# Patient Record
Sex: Female | Born: 1960 | Race: White | Hispanic: No | Marital: Married | Smoking: Never smoker
Health system: Southern US, Community
[De-identification: ages and names within clinical notes are randomized; demographics above are authoritative.]

## PROBLEM LIST (undated history)

## (undated) DIAGNOSIS — G4733 Obstructive sleep apnea (adult) (pediatric): Secondary | ICD-10-CM

## (undated) DIAGNOSIS — G4730 Sleep apnea, unspecified: Secondary | ICD-10-CM

## (undated) DIAGNOSIS — N951 Menopausal and female climacteric states: Secondary | ICD-10-CM

## (undated) DIAGNOSIS — R42 Dizziness and giddiness: Secondary | ICD-10-CM

## (undated) HISTORY — DX: Dizziness and giddiness: R42

## (undated) HISTORY — DX: Obstructive sleep apnea (adult) (pediatric): G47.33

## (undated) HISTORY — PX: COLONOSCOPY: SHX174

## (undated) HISTORY — DX: Sleep apnea, unspecified: G47.30

## (undated) HISTORY — DX: Menopausal and female climacteric states: N95.1

---

## 1990-07-09 HISTORY — PX: TUBAL LIGATION: SHX77

## 2012-10-24 ENCOUNTER — Encounter: Payer: Self-pay | Admitting: Neurology

## 2012-10-30 NOTE — Progress Notes (Signed)
Quick Note:  I reviewed the patient's CPAP compliance data from 04/20/2012 to 4/10 2014, which is a total of 180 days, during which time the patient used CPAP every day. The average usage for all days was 6 hours and 56 minutes. The percent used days greater than 4 hours was 97.2 %, indicating excellent compliance. The residual AHI was 1 per hour, indicating an appropriate treatment pressure of 9 cwp.   Huston Foley, MD, PhD [facility] ([facility])   ______

## 2012-11-10 ENCOUNTER — Telehealth: Payer: Self-pay | Admitting: Neurology

## 2012-11-10 DIAGNOSIS — G4733 Obstructive sleep apnea (adult) (pediatric): Secondary | ICD-10-CM

## 2012-11-10 NOTE — Telephone Encounter (Signed)
CPAp download reviewed.

## 2012-12-03 ENCOUNTER — Encounter: Payer: Self-pay | Admitting: Neurology

## 2012-12-03 ENCOUNTER — Ambulatory Visit (INDEPENDENT_AMBULATORY_CARE_PROVIDER_SITE_OTHER): Payer: Managed Care, Other (non HMO) | Admitting: Neurology

## 2012-12-03 VITALS — BP 112/74 | HR 81 | Temp 98.7°F | Ht 66.0 in | Wt 229.0 lb

## 2012-12-03 DIAGNOSIS — G4733 Obstructive sleep apnea (adult) (pediatric): Secondary | ICD-10-CM

## 2012-12-03 DIAGNOSIS — N951 Menopausal and female climacteric states: Secondary | ICD-10-CM | POA: Insufficient documentation

## 2012-12-03 NOTE — Progress Notes (Signed)
[facility]  Provider:  Dr Wanya Bangura Referring Provider: Harvie Heck, MD Primary Care Physician:  Harvie Heck, MD  Chief Complaint  Patient presents with  . New Evaluation    sleep study results, rm 11    HPI:  Natasha Lee is a 52 y.o. female here as a referral from Dr. Larita Fife  MD. Natasha Lee was originally referred at the end of 2012 directly for a sleep study the sleep study was performed on 1-11 2013 and current into a split study. She had presented with a past medical history of morbid obesity a weight gain of approximately 50 pounds within the past year prior to study and hypertension, ankle edema. The patient presented with multiple sleep complaints including loud snoring which of bulk or, witnessed apneas, frequent nocturia, unrefreshing nonrestorative sleep. The patient also had excessive daytime hypersomnolence reflected in the pre-study Epworth Sleepiness Scale and Natasha Lee at 19 points. Beck Depression Inventory was endorsed at 22 points. Her BMI at the time of the study was 37. Neck circumference 17 inches.  The patient's bedtime had been 10 PM she geot up at 4:30 AM may have one or 4-5 bathroom breaks at night. 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 She changed in2008 to first shift and started gaining weight rapidly.    She was titrated to 9 cm water and reached an AHI of 0 during that sleep study in 2013 - felt immediately better. Chose a Nasal pillow.Pilairo model .  A download at and involving forwarded from [facility] shows an average residual AHI of 1.0 at CPAP pressure of 9 cm and 100% compliance for 90 days.  The patient has been very happy with her CPAP if faxed she said that she cannot easily go to sleep without it and feels that she certainly would never be as restored and she feels now  in the morning without adhering to CPAP therapy she endorsed today her sleepiness score at 8 points.  Prior to CPAP the patient had nocturia and frequent bathroom breaks, she now has one or 2 bathroom break at the moles to on CPAP she also takes her furosemide in the morning which was initiated to combat ankle edema. This medication change  Took place after the sleep study. Aware that her main risk factor for sleep apnea is her elevated body mass index and her primary care physician gave her a prescription of Phentermine, which was just recently initiated but has already allowed for a 12 pound weight loss over a month.  Patient  has no history of nasal septal deviation or facial oral surgeries that may have affected the upper airway. She never had a tonsillectomy. 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  Became a dialysis patient and has last years of life, had diabetes and diabetic related renal failure as well as high blood pressure.He had apnea but was not treated . Maternal grandmother had similar problems , HTN, DM and renal failure. These family members were not overweight.         Review of Systems: Out of a complete 14 system review, the patient complains of only the following symptoms, and all other reviewed systems are negative.  intended weight loss, Epworth down to 8 from 19  points pre CPAP.   History   Social History  . Marital Status: Married    Spouse Name: N/A    Number of Children: 2  . Years of Education: 12   Occupational History  .  [employer]   Social History Main Topics  . Smoking status: Never Smoker   . Smokeless tobacco: Not on file  . Alcohol Use: No  . Drug Use: No  . Sexually Active: Not on file   Other Topics Concern  . Not on file   Social History Narrative  . No narrative on file    Family History  Problem Relation Age of Onset  . Hypertension Father     Past Medical History   Diagnosis Date  . Vertigo   . Menopausal symptoms   . Obstructive apnea     sleep study 07-20-11 , AHI 61.6- OSA  9 cm CPAP.   Marland Kitchen Menopause syndrome     Past Surgical History  Procedure Laterality Date  . Tubal ligation  1992    Current Outpatient Prescriptions  Medication Sig Dispense Refill  . Cetirizine HCl (ZYRTEC ALLERGY PO) Take by mouth daily as needed.      . furosemide (LASIX) 20 MG tablet PCP for edema      . phentermine (ADIPEX-P) 37.5 MG tablet Weight loss , 12 pounds  Loss,  Italy Badger, MD .       No current facility-administered medications for this visit.    Allergies as of 12/03/2012  . (No Known Allergies)    Vitals: BP 112/74  Pulse 81  Temp(Src) 98.7 F (37.1 C) (Oral)  Ht 5\' 6"  (1.676 m)  Wt 229 lb (103.874 kg)  BMI 36.98 kg/m2 Last Weight:  Wt Readings from Last 1 Encounters:  12/03/12 229 lb (103.874 kg)   Last Height:   Ht Readings from Last 1 Encounters:  12/03/12 5\' 6"  (1.676 m)   Physical exam:  General: The patient is awake, alert and appears not in acute distress. The patient is well groomed. Head: Normocephalic, atraumatic. Neck is supple. Mallampati highest grade, uvula is poorly visible. , neck circumference:17.inches  Cardiovascular:  Regular rate and rhythm, without  murmurs or carotid bruit, and without distended neck veins. Respiratory: Lungs are clear to auscultation. Skin:  Without evidence of edema, or rash Trunk: BMI is elevated to morbid obesity , patient  has normal posture.  Neurologic exam : The patient is awake and alert, oriented to place and time.  Memory subjective  described as intact. There is a normal attention span & concentration ability. Speech is fluent without  dysarthria, dysphonia or aphasia. Mood and affect are appropriate.  Cranial nerves: Pupils are equal and briskly reactive to light. Funduscopic exam without  evidence of pallor or edema. Extraocular movements  in vertical and horizontal planes intact  and without nystagmus. Visual fields by finger perimetry are intact. Hearing to finger rub intact.  Facial sensation intact to fine touch. Facial motor strength is symmetric and tongue and uvula move midline.  Motor exam:   Normal tone and normal muscle bulk and symmetric normal strength in all extremities.  Sensory:  Fine touch, pinprick and vibration were tested in all extremities. Proprioception is tested in the upper extremities only.  Coordination: Rapid alternating movements in the fingers/hands is tested and normal. Finger-to-nose maneuver tested and normal without evidence of ataxia, dysmetria or tremor.  Gait and station: Patient walks without assistive device and is able and assisted stool climb up to the  exam table. Strength within normal limits. Deep tendon reflexes: in the  upper and lower extremities are symmetric and intact.   Assessment:  After physical and neurologic examination, review of laboratory studies, imaging, neurophysiology testing and pre-existing records, assessment will be reviewed on the problem list.  Plan:  Treatment plan and additional workup will be reviewed under Problem List.  Is read is well aware that her main risk factor for obstructive sleep apnea as her body weight, she also has mild retrognathia which further narrows for upper airway. A dental device is deemed not to be efficient in the apnea of the degree that this patient presented with. She is 100% compliant with CPAP and her Epworth has been reduced by 10 points, she has no longer nocturia, she has no longer ankle it deep. She is very motivated to continue its use. I will have another download of CPAP data end 3 months and then fall the patient yearly. Educational material is provided the risk factors for apnea and apnea as a risk factor of 4 secondary diseases I discussed and documented. The patient is well informed about her condition.Marland Kitchen

## 2012-12-03 NOTE — Patient Instructions (Signed)
Weight loss by the  2 and 5 diet, also known as    CPAP and BIPAP CPAP and BIPAP are methods of helping you breathe. CPAP stands for "continuous positive airway pressure." BIPAP stands for "bi-level positive airway pressure." Both CPAP and BIPAP are provided by a small machine with a flexible plastic tube that attaches to a plastic mask that goes over your nose or mouth. Air is blown into your air passages through your nose or mouth. This helps to keep your airways open and helps to keep you breathing well. The amount of pressure that is used to blow the air into your air passages can be set on the machine. The pressure setting is based on your needs. With CPAP, the amount of pressure stays the same while you breathe in and out. With BIPAP, the amount of pressure changes when you inhale and exhale. Your caregiver will recommend whether CPAP or BIPAP would be more helpful for you.  CPAP and BIPAP can be helpful for both adults and children with:  Sleep apnea.  Chronic Obstructive Pulmonary Disease (COPD), a condition like emphysema.  Diseases which weaken the muscles of the chest such as muscular dystrophy or neurological diseases.  Other problems that cause breathing to be weak or difficult. USE OF CPAP OR BIPAP The respiratory therapist or technician will help you get used to wearing the mask. Some people feel claustrophobic (a trapped or closed in feeling) at first, because the mask needs to be fairly snug on your face.   It may help you to get used to the mask gradually, by first holding the mask loosely over your nose or mouth using a low pressure setting on the machine. Gradually the mask can be applied more snugly with increased pressure. You can also gradually increase the amount of time the mask is used.  People with sleep apnea will use the mask and machine at night when they are sleeping. Others, like those with ALS or other breathing difficulties, may need the CPAP or BIPAP all the  time.  If the first mask you try does not fit well, or is uncomfortable, there are other types and sizes that can be tried.  If you tend to breathe through your mouth, a chin strap may be applied to help keep your mouth closed (if you are using a nasal mask).  The CPAP and BIPAP machines have alarms that may sound if the mask comes off or develops a leak.  You should not eat or drink while the CPAP or BIPAP is on. Food or fluids could get pushed into your lungs by the pressure of the CPAP or BIPAP. Sometimes CPAP or BIPAP machines are ordered for home use. If you are going to use the CPAP or BIPAP machine at home, follow these instructions  CPAP or BIPAP machines can be rented or purchased through home health care companies. There are many different brands of machines available. If you rent a machine before purchasing you may find which particular machine works well for you.  Ask questions if there is something you do not understand when picking out your machine.  Place your CPAP or BIPAP machine on a secure table or stand near an electrical outlet.  Know where the On/Off switch is.  Follow your doctor's instructions for how to set the pressure on your machine and when you should use it.  Do not smoke! Tobacco smoke residue can damage the machine. SEEK IMMEDIATE MEDICAL CARE IF:   You  have redness or open areas around your nose or mouth.  You have trouble operating the CPAP or BIPAP machine.  You cannot tolerate wearing the CPAP or BIPAP mask.  You have any questions or concerns. Document Released: 03/23/2004 Document Revised: 09/17/2011 Document Reviewed: 06/22/2008 [facility] Patient Information 2014 [place], [place]. Sleep Apnea  Sleep apnea is a sleep disorder characterized by abnormal pauses in breathing while you sleep. When your breathing pauses, the level of oxygen in your blood decreases. This causes you to move out of deep sleep and into light sleep. As a result, your quality  of sleep is poor, and the system that carries your blood throughout your body (cardiovascular system) experiences stress. If sleep apnea remains untreated, the following conditions can develop:  High blood pressure (hypertension).  Coronary artery disease.  Inability to achieve or maintain an erection (impotence).  Impairment of your thought process (cognitive dysfunction). There are three types of sleep apnea: 1. Obstructive sleep apnea Pauses in breathing during sleep because of a blocked airway. 2. Central sleep apnea Pauses in breathing during sleep because the area of the brain that controls your breathing does not send the correct signals to the muscles that control breathing. 3. Mixed sleep apnea A combination of both obstructive and central sleep apnea. RISK FACTORS The following risk factors can increase your risk of developing sleep apnea:  Being overweight.  Smoking.  Having narrow passages in your nose and throat.  Being of older age.  Being female.  Alcohol use.  Sedative and tranquilizer use.  Ethnicity. Among individuals younger than 35 years, African Americans are at increased risk of sleep apnea. SYMPTOMS   Difficulty staying asleep.  Daytime sleepiness and fatigue.  Loss of energy.  Irritability.  Loud, heavy snoring.  Morning headaches.  Trouble concentrating.  Forgetfulness.  Decreased interest in sex. DIAGNOSIS  In order to diagnose sleep apnea, your caregiver will perform a physical examination. Your caregiver may suggest that you take a home sleep test. Your caregiver may also recommend that you spend the night in a sleep lab. In the sleep lab, several monitors record information about your heart, lungs, and brain while you sleep. Your leg and arm movements and blood oxygen level are also recorded. TREATMENT The following actions may help to resolve mild sleep apnea:  Sleeping on your side.   Using a decongestant if you have nasal  congestion.   Avoiding the use of depressants, including alcohol, sedatives, and narcotics.   Losing weight and modifying your diet if you are overweight. There also are devices and treatments to help open your airway:  Oral appliances. These are custom-made mouthpieces that shift your lower jaw forward and slightly open your bite. This opens your airway.  Devices that create positive airway pressure. This positive pressure "splints" your airway open to help you breathe better during sleep. The following devices create positive airway pressure:  Continuous positive airway pressure (CPAP) device. The CPAP device creates a continuous level of air pressure with an air pump. The air is delivered to your airway through a mask while you sleep. This continuous pressure keeps your airway open.  Nasal expiratory positive airway pressure (EPAP) device. The EPAP device creates positive air pressure as you exhale. The device consists of single-use valves, which are inserted into each nostril and held in place by adhesive. The valves create very little resistance when you inhale but create much more resistance when you exhale. That increased resistance creates the positive airway pressure. This positive  pressure while you exhale keeps your airway open, making it easier to breath when you inhale again.  Bilevel positive airway pressure (BPAP) device. The BPAP device is used mainly in patients with central sleep apnea. This device is similar to the CPAP device because it also uses an air pump to deliver continuous air pressure through a mask. However, with the BPAP machine, the pressure is set at two different levels. The pressure when you exhale is lower than the pressure when you inhale.  Surgery. Typically, surgery is only done if you cannot comply with less invasive treatments or if the less invasive treatments do not improve your condition. Surgery involves removing excess tissue in your airway to create a  wider passage way. Document Released: 06/15/2002 Document Revised: 12/25/2011 Document Reviewed: 11/01/2011 [facility] Patient Information 2014 [place], [place].  intermittent fast. I encouraged the patient to look at either the book of Dr. Leland Johns or watch the [facility] documentary .  Exercise to Lose Weight Exercise and a healthy diet may help you lose weight. Your doctor may suggest specific exercises. EXERCISE IDEAS AND TIPS  Choose low-cost things you enjoy doing, such as walking, bicycling, or exercising to workout videos.  Take stairs instead of the elevator.  Walk during your lunch break.  Park your car further away from work or school.  Go to a gym or an exercise class.  Start with 5 to 10 minutes of exercise each day. Build up to 30 minutes of exercise 4 to 6 days a week.  Wear shoes with good support and comfortable clothes.  Stretch before and after working out.  Work out until you breathe harder and your heart beats faster.  Drink extra water when you exercise.  Do not do so much that you hurt yourself, feel dizzy, or get very short of breath. Exercises that burn about 150 calories:  Running 1  miles in 15 minutes.  Playing volleyball for 45 to 60 minutes.  Washing and waxing a car for 45 to 60 minutes.  Playing touch football for 45 minutes.  Walking 1  miles in 35 minutes.  Pushing a stroller 1  miles in 30 minutes.  Playing basketball for 30 minutes.  Raking leaves for 30 minutes.  Bicycling 5 miles in 30 minutes.  Walking 2 miles in 30 minutes.  Dancing for 30 minutes.  Shoveling snow for 15 minutes.  Swimming laps for 20 minutes.  Walking up stairs for 15 minutes.  Bicycling 4 miles in 15 minutes.  Gardening for 30 to 45 minutes.  Jumping rope for 15 minutes.  Washing windows or floors for 45 to 60 minutes. Document Released: 07/28/2010 Document Revised: 09/17/2011 Document Reviewed: 07/28/2010 [facility] Patient Information 2014  [place], [place].

## 2012-12-03 NOTE — Assessment & Plan Note (Signed)
This is a first encounter for this patient after  of 14 months on CPAP.  Current download provided [facility] F[facility]rene Fearing Bobbitt) at residual AHI of 1.0 at 9  cm CPAP and an average he was at time of 7 hours 8 minutes 100% compliance..date 07-14-12  07-14-12.

## 2012-12-06 ENCOUNTER — Encounter: Payer: Self-pay | Admitting: Neurology

## 2013-02-27 ENCOUNTER — Ambulatory Visit: Payer: Managed Care, Other (non HMO) | Admitting: Neurology

## 2013-03-04 ENCOUNTER — Ambulatory Visit: Payer: Managed Care, Other (non HMO) | Admitting: Neurology

## 2013-03-13 ENCOUNTER — Ambulatory Visit: Payer: Managed Care, Other (non HMO) | Admitting: Neurology

## 2013-03-31 ENCOUNTER — Telehealth: Payer: Self-pay | Admitting: Neurology

## 2013-03-31 NOTE — Telephone Encounter (Signed)
I called and left a message for the patient to callback to the office to r/s her Dec. 24 appt. with Dr. Dohmeier. 

## 2013-04-24 ENCOUNTER — Encounter: Payer: Self-pay | Admitting: Neurology

## 2013-05-05 ENCOUNTER — Encounter: Payer: Self-pay | Admitting: Neurology

## 2013-07-01 ENCOUNTER — Ambulatory Visit: Payer: Managed Care, Other (non HMO) | Admitting: Neurology

## 2013-11-03 ENCOUNTER — Encounter: Payer: Self-pay | Admitting: Neurology

## 2013-11-05 ENCOUNTER — Encounter: Payer: Self-pay | Admitting: Neurology

## 2014-01-06 ENCOUNTER — Encounter (INDEPENDENT_AMBULATORY_CARE_PROVIDER_SITE_OTHER): Payer: Self-pay

## 2014-01-06 ENCOUNTER — Encounter: Payer: Self-pay | Admitting: Neurology

## 2014-01-06 ENCOUNTER — Ambulatory Visit (INDEPENDENT_AMBULATORY_CARE_PROVIDER_SITE_OTHER): Payer: BC Managed Care – PPO | Admitting: Neurology

## 2014-01-06 VITALS — BP 114/75 | HR 91 | Resp 18 | Ht 65.75 in | Wt 235.0 lb

## 2014-01-06 DIAGNOSIS — G4733 Obstructive sleep apnea (adult) (pediatric): Secondary | ICD-10-CM | POA: Insufficient documentation

## 2014-01-06 DIAGNOSIS — Z9989 Dependence on other enabling machines and devices: Principal | ICD-10-CM

## 2014-01-06 NOTE — Patient Instructions (Signed)
Sleep Apnea  Sleep apnea is a sleep disorder characterized by abnormal pauses in breathing while you sleep. When your breathing pauses, the level of oxygen in your blood decreases. This causes you to move out of deep sleep and into light sleep. As a result, your quality of sleep is poor, and the system that carries your blood throughout your body (cardiovascular system) experiences stress. If sleep apnea remains untreated, the following conditions can develop:  High blood pressure (hypertension).  Coronary artery disease.  Inability to achieve or maintain an erection (impotence).  Impairment of your thought process (cognitive dysfunction). There are three types of sleep apnea: 1. Obstructive sleep apnea--Pauses in breathing during sleep because of a blocked airway. 2. Central sleep apnea--Pauses in breathing during sleep because the area of the brain that controls your breathing does not send the correct signals to the muscles that control breathing. 3. Mixed sleep apnea--A combination of both obstructive and central sleep apnea. RISK FACTORS The following risk factors can increase your risk of developing sleep apnea:  Being overweight.  Smoking.  Having narrow passages in your nose and throat.  Being of older age.  Being female.  Alcohol use.  Sedative and tranquilizer use.  Ethnicity. Among individuals younger than 35 years, African Americans are at increased risk of sleep apnea. SYMPTOMS   Difficulty staying asleep.  Daytime sleepiness and fatigue.  Loss of energy.  Irritability.  Loud, heavy snoring.  Morning headaches.  Trouble concentrating.  Forgetfulness.  Decreased interest in sex. DIAGNOSIS  In order to diagnose sleep apnea, your caregiver will perform a physical examination. Your caregiver may suggest that you take a home sleep test. Your caregiver may also recommend that you spend the night in a sleep lab. In the sleep lab, several monitors record  information about your heart, lungs, and brain while you sleep. Your leg and arm movements and blood oxygen level are also recorded. TREATMENT The following actions may help to resolve mild sleep apnea:  Sleeping on your side.   Using a decongestant if you have nasal congestion.   Avoiding the use of depressants, including alcohol, sedatives, and narcotics.   Losing weight and modifying your diet if you are overweight. There also are devices and treatments to help open your airway:  Oral appliances. These are custom-made mouthpieces that shift your lower jaw forward and slightly open your bite. This opens your airway.  Devices that create positive airway pressure. This positive pressure "splints" your airway open to help you breathe better during sleep. The following devices create positive airway pressure:  Continuous positive airway pressure (CPAP) device. The CPAP device creates a continuous level of air pressure with an air pump. The air is delivered to your airway through a mask while you sleep. This continuous pressure keeps your airway open.  Nasal expiratory positive airway pressure (EPAP) device. The EPAP device creates positive air pressure as you exhale. The device consists of single-use valves, which are inserted into each nostril and held in place by adhesive. The valves create very little resistance when you inhale but create much more resistance when you exhale. That increased resistance creates the positive airway pressure. This positive pressure while you exhale keeps your airway open, making it easier to breath when you inhale again.  Bilevel positive airway pressure (BPAP) device. The BPAP device is used mainly in patients with central sleep apnea. This device is similar to the CPAP device because it also uses an air pump to deliver continuous air pressure   through a mask. However, with the BPAP machine, the pressure is set at two different levels. The pressure when you  exhale is lower than the pressure when you inhale.  Surgery. Typically, surgery is only done if you cannot comply with less invasive treatments or if the less invasive treatments do not improve your condition. Surgery involves removing excess tissue in your airway to create a wider passage way. Document Released: 06/15/2002 Document Revised: 10/20/2012 Document Reviewed: 11/01/2011 [employer] Patient Information 2015 [employer]. This information is not intended to replace advice given to you by your health care provider. Make sure you discuss any questions you have with your health care provider.  

## 2014-01-06 NOTE — Progress Notes (Signed)
[facility]  Provider:  Dr Cuong Moorman Referring Provider: Harvie Heck, MD Primary Care Physician:  Harvie Heck, MD  Chief Complaint  Patient presents with  . Follow-up    Room 11  . Sleep Apnea    HPI:    Natasha Lee is here today for her yearly revisit. 01-06-14 This is also meant to establish her CPAP compliance. The patient has been on  CPAP 28 month had been followed by [facility]. AHI has been 0.8 /hr. at a CPAP pressure of 9 cm water.  The average time of CPAP use at night to 6 hours and 27 minutes and she has 100% compliance for 30 day download. She has brought her machine to this visit.  The patient endorsed fatigue severity score at 26 points and the Epworth score at 3 points.     Prior to CPAP, the sleepiness score was endorsed at 19 points and her fatigue severity was extremely high as well. She goes to bed between 9 and 10 PM, goes to sleep promptly. Rises at 4.30 AM, she has 2 dogs to walk. Her husband rises an hour later. She drinks no coffee in the morning , neither any other form of caffeine.  She does not nap in the afternoon.  She wakes restored, refreshed, without headaches or dry mouth.   She gained weight from menopause. Hot flashes still present, but not interrupting her sleep as much.      PMHX: last visit 2014 :   Natasha Lee is a 53 y.o. female here as a referral from Dr. Larita Fife  MD. [name] was originally referred at the end of 2012 directly for a sleep study,  sleep study was performed on 1-11 2013 and turned into a split study.  She had presented with a past medical history of morbid obesity,  a weight gain of approximately 50 pounds within the past year prior to study , and hypertension, ankle edema.  The patient presented with multiple sleep complaints:  including loud snoring , witnessed apneas, frequent nocturia, unrefreshing non-restorative sleep.  The patient also had excessive daytime hypersomnolence reflected in the  pre-study Epworth Sleepiness Scale and Natasha Lee at 19 points. Beck Depression Inventory was endorsed at 22 points. Her BMI at the time of the study was 37. Neck circumference 17 inches.  The patient's bedtime had been 10 PM she geot up at 4:30 AM may have one or 4-5 bathroom breaks at night. 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 She changed in2008 to first shift and started gaining weight rapidly.    She was titrated to 9 cm water and reached an AHI of 0 during that sleep study in 2013 - felt immediately better. Chose a Nasal pillow.Pilairo model .  A download at and involving forwarded from [facility] shows an average residual AHI of 1.0 at CPAP pressure of 9 cm and 100% compliance for 90 days.  The patient has been very happy with her CPAP if faxed she said that she cannot easily go to sleep without it and feels that she certainly would never be as restored and she feels now in the morning without adhering to CPAP therapy she endorsed today her sleepiness score at 8 points.  Prior to CPAP the patient had nocturia and frequent bathroom breaks, she now has  one or 2 bathroom break at the moles to on CPAP she also takes her furosemide in the morning which was initiated to combat ankle edema. This medication change  Took place after the sleep study.  Patient  has no history of nasal septal deviation or facial oral surgeries that may have affected the upper airway. She never had a tonsillectomy.  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  Became a dialysis patient and has last years of life, had diabetes and diabetic related renal failure as well as high blood pressure.He had apnea but was not treated . Maternal grandmother had similar problems , HTN, DM and renal failure. These family  members were not overweight.   She has a third shift work history , until 10 years ago.      Review of Systems: Out of a complete 14 system review, the patient complains of only the following symptoms, and all other reviewed systems are negative.  intended weight loss, Epworth down to 3 from last years count at  8 points and  from 18-19 points pre CPAP.   History   Social History  . Marital Status: Married    Spouse Name: Natasha Lee    Number of Children: 2  . Years of Education: 12   Occupational History  .  [employer]   Social History Main Topics  . Smoking status: Never Smoker   . Smokeless tobacco: Never Used  . Alcohol Use: No  . Drug Use: No  . Sexual Activity: Not on file   Other Topics Concern  . Not on file   Social History Narrative   Patient is married Natasha Needle(Michael ) and lives at home with her husband and one of her children.   Patient has two adult children.   Patient is working full-time.   Patient has a high school education.   Patient is right-handed.   Patient does not drink any caffeine.    Family History  Problem Relation Age of Onset  . Hypertension Father     Past Medical History  Diagnosis Date  . Vertigo   . Menopausal symptoms   . Obstructive apnea     sleep study 07-20-11 , AHI 61.6- OSA  9 cm CPAP.   Marland Kitchen. Menopause syndrome     Past Surgical History  Procedure Laterality Date  . Tubal ligation  1992    Current Outpatient Prescriptions  Medication Sig Dispense Refill  . Cetirizine HCl (ZYRTEC ALLERGY PO) Take by mouth daily as needed.      . furosemide (LASIX) 20 MG tablet PCP for edema       No current facility-administered medications for this visit.    Allergies as of 01/06/2014  . (No Known Allergies)    Vitals: BP 114/75  Pulse 91  Resp 18  Ht 5' 5.75" (1.67 m)  Wt 235 lb (106.595 kg)  BMI 38.22 kg/m2 Last Weight:  Wt Readings from Last 1 Encounters:  01/06/14 235 lb (106.595 kg)   Last Height:   Ht Readings from Last 1  Encounters:  01/06/14 5' 5.75" (1.67 m)   Physical exam:  General: The patient is awake, alert and appears not in acute distress. The patient is well groomed. Head: Normocephalic, atraumatic. Neck is supple. Mallampati highest grade, uvula is poorly visible. , neck circumference:17.5 inches .  Cardiovascular:  Regular rate and rhythm, without  murmurs or carotid bruit, and without distended neck veins. Respiratory: Lungs  are clear to auscultation. Skin:  Without evidence of edema, or rash Trunk: BMI is elevated to morbid obesity , patient  has normal posture.  Neurologic exam : The patient is awake and alert, oriented to place and time.  Memory subjective described as intact.  There is a normal attention span & concentration ability. Speech is fluent without  dysarthria, dysphonia or aphasia. Mood and affect are appropriate.  Cranial nerves: Pupils are equal and briskly reactive to light. Funduscopic exam without  evidence of pallor or edema.  Extraocular movements in vertical and horizontal planes intact and without nystagmus.  Visual fields by finger perimetry are intact. Hearing to finger rub intact.  Facial sensation intact to fine touch. Facial motor strength is symmetric and tongue and uvula move midline.  Motor exam:   Normal tone and normal muscle bulk and symmetric normal strength in all extremities.  Sensory:  Fine touch, pinprick and vibration were tested in all extremities.  Proprioception is tested in the upper extremities only.  Coordination: Rapid alternating movements in the fingers/hands is tested and normal.  Finger-to-nose maneuver tested and normal without evidence of ataxia, dysmetria or tremor.  Gait and station: Patient walks without assistive device and is able and assisted stool climb up to the exam table. Strength within normal limits. Deep tendon reflexes: in the  upper and lower extremities are symmetric and intact.   Assessment:  After physical and  neurologic examination, review of laboratory studies, imaging, neurophysiology testing and pre-existing records, assessment will be reviewed on the problem list.  Plan:  Treatment plan and additional workup will be reviewed under Problem List.  Patient is well aware that her main risk factor for obstructive sleep apnea as her body weight, she also has mild retrognathia which further narrows for upper airway.   A dental device is deemed not to be efficient in the apnea of the degree that this patient presented with.   She remains 100% compliant with CPAP and her Epworth has been reduced by 15 points, she has no longer nocturia, she has no longer ankle edema .  She is very motivated to continue it's use. I will have another download of CPAP  yearly. The patient is well informed about her condition..Marland Kitchen

## 2014-04-02 ENCOUNTER — Encounter: Payer: Self-pay | Admitting: Internal Medicine

## 2015-01-07 ENCOUNTER — Ambulatory Visit: Payer: BC Managed Care – PPO | Admitting: Adult Health

## 2015-01-12 ENCOUNTER — Ambulatory Visit: Payer: Self-pay | Admitting: Adult Health

## 2015-01-19 ENCOUNTER — Encounter: Payer: Self-pay | Admitting: Adult Health

## 2015-01-19 ENCOUNTER — Ambulatory Visit (INDEPENDENT_AMBULATORY_CARE_PROVIDER_SITE_OTHER): Payer: BLUE CROSS/BLUE SHIELD | Admitting: Adult Health

## 2015-01-19 ENCOUNTER — Telehealth: Payer: Self-pay

## 2015-01-19 VITALS — BP 124/81 | HR 70 | Ht 66.0 in | Wt 240.0 lb

## 2015-01-19 DIAGNOSIS — G4733 Obstructive sleep apnea (adult) (pediatric): Secondary | ICD-10-CM | POA: Diagnosis not present

## 2015-01-19 DIAGNOSIS — E669 Obesity, unspecified: Secondary | ICD-10-CM

## 2015-01-19 DIAGNOSIS — Z9989 Dependence on other enabling machines and devices: Principal | ICD-10-CM

## 2015-01-19 NOTE — Telephone Encounter (Signed)
Called [facility] and spoke to [place] at [phone]. Patient needed and new chip and she had to pay $25.00. Apolinar JunesBrandon will fax down load to me as soon as he can. [facility] [place] [facility] [place] open.

## 2015-01-19 NOTE — Progress Notes (Signed)
PATIENT: Natasha Lee DOB: 09/26/1960  REASON FOR VISIT: follow up- OSA on CPAP HISTORRichardean SaleY FROM: patient  HISTORY OF PRESENT ILLNESS: Natasha Lee is a 54 year old female with a history of obstructive sleep apnea on CPAP. She returns today for follow-up. The patient states that she did have a memory card however she sent it to her DME company and never got her card back. She states that she did call the company and they are sending her a new one. She states that she has continued to use her CPAP nightly. She goes to bed between 9 and 10 PM and arises at 6 AM. She usually gets up one time to urinate. Her Epworth sleepiness score is 3 and fatigue severity score is 36. Overall patient feels that the CPAP has been very beneficial for her. She returns today for an evaluation.  HISTORY 01/06/14 (CD): Natasha Lee is here today for her yearly revisit. 01-06-14 This is also meant to establish her CPAP compliance. The patient has been on CPAP 28 month had been followed by [facility]. AHI has been 0.8 /hr. at a CPAP pressure of 9 cm water.  The average time of CPAP use at night to 6 hours and 27 minutes and she has 100% compliance for 30 day download. She has brought her machine to this visit.  The patient endorsed fatigue severity score at 26 points and the Epworth score at 3 points.    Prior to CPAP, the sleepiness score was endorsed at 19 points and her fatigue severity was extremely high as well. She goes to bed between 9 and 10 PM, goes to sleep promptly. Rises at 4.30 AM, she has 2 dogs to walk. Her husband rises an hour later. She drinks no coffee in the morning , neither any other form of caffeine.  She does not nap in the afternoon. She wakes restored, refreshed, without headaches or dry mouth.   She gained weight from menopause. Hot flashes still present, but not interrupting her sleep as much.      REVIEW OF SYSTEMS: Out of a complete 14 system review of symptoms, the patient complains only of  the following symptoms, and all other reviewed systems are negative.   Unexpected weight change, dizziness  ALLERGIES: No Known Allergies  HOME MEDICATIONS: Outpatient Prescriptions Prior to Visit  Medication Sig Dispense Refill  . Cetirizine HCl (ZYRTEC ALLERGY PO) Take by mouth daily as needed.    . furosemide (LASIX) 20 MG tablet PCP for edema     No facility-administered medications prior to visit.    PAST MEDICAL HISTORY: Past Medical History  Diagnosis Date  . Vertigo   . Menopausal symptoms   . Obstructive apnea     sleep study 07-20-11 , AHI 61.6- OSA  9 cm CPAP.   Marland Kitchen. Menopause syndrome     PAST SURGICAL HISTORY: Past Surgical History  Procedure Laterality Date  . Tubal ligation  1992    FAMILY HISTORY: Family History  Problem Relation Age of Onset  . Hypertension Father     SOCIAL HISTORY: History   Social History  . Marital Status: Married    Spouse Name: Casimiro NeedleMichael  . Number of Children: 2  . Years of Education: 12   Occupational History  .  [employer]   Social History Main Topics  . Smoking status: Never Smoker   . Smokeless tobacco: Never Used  . Alcohol Use: No  . Drug Use: No  . Sexual Activity: Not on file  Other Topics Concern  . Not on file   Social History Narrative   Patient is married Casimiro Needle ) and lives at home with her husband and one of her children.   Patient has two adult children.   Patient is working full-time.   Patient has a high school education.   Patient is right-handed.   Patient does not drink any caffeine.      PHYSICAL EXAM  Filed Vitals:   01/19/15 0731  BP: 124/81  Pulse: 70  Height:  (1.676 m)  Weight: 240 lb (108.863 kg)   Body mass index is 38.76 kg/(m^2).  Generalized: Well developed, in no acute distress  Neck: Circumference 17 inches  Neurological examination  Mentation: Alert oriented to time, place, history taking. Follows all commands speech and language fluent Cranial  nerve II-XII: Pupils were equal round reactive to light. Extraocular movements were full, visual field were full on confrontational test. Facial sensation and strength were normal. Uvula tongue midline. Head turning and shoulder shrug  were normal and symmetric. Motor: The motor testing reveals 5 over 5 strength of all 4 extremities. Good symmetric motor tone is noted throughout.  Sensory: Sensory testing is intact to soft touch on all 4 extremities. No evidence of extinction is noted.  Coordination: Cerebellar testing reveals good finger-nose-finger and heel-to-shin bilaterally.  Gait and station: Gait is normal.  Reflexes: Deep tendon reflexes are symmetric and normal bilaterally.   DIAGNOSTIC DATA (LABS, IMAGING, TESTING) - I reviewed patient records, labs, notes, testing and imaging myself where available.     ASSESSMENT AND PLAN 54 y.o. year old female  has a past medical history of Vertigo; Menopausal symptoms; Obstructive apnea; and Menopause syndrome. here with:  1. OSA on CPAP 2. Obesity  The patient does not have her memory card today therefore we were unable to obtain a download. She is waiting for a new card to be sent from her DME company. I have advised the patient that when she gets her card and uses it for approximately 2 months she should bring her card to the sleep lab for a download. Patient verbalized understanding. Also encouraged the patient to participate in daily exercise and a healthy diet. Patient verbalized understanding. If her symptoms worsen or she develops new symptoms she should let us know. Otherwise she will follow-up in one year or sooner if needed.  Butch Penny, MSN, NP-C 01/19/2015, 7:28 AM [facility] [address], [address] [place], [place] [address] [phone]  Note: This document was prepared with digital dictation and possible smart phrase technology. Any transcriptional errors that result from this process are  unintentional.

## 2015-01-19 NOTE — Progress Notes (Signed)
I agree with the assessment and plan as directed by NP .The patient is known to me .   Didi Ganaway, MD  

## 2015-01-19 NOTE — Patient Instructions (Signed)
Once you get your card and use it for 2 months then bring your card to the sleep lab for a download.

## 2015-03-23 ENCOUNTER — Telehealth: Payer: Self-pay

## 2015-03-23 NOTE — Telephone Encounter (Signed)
Pt dropped off her cpap chip to be downloaded. I downloaded the chip and gave the report to Aundra Millet, NP. I advised pt of this and advised her that her chip is ready for pick up at the front desk. Pt verbalized understanding.

## 2015-03-24 ENCOUNTER — Telehealth: Payer: Self-pay | Admitting: Adult Health

## 2015-03-24 NOTE — Telephone Encounter (Signed)
I called pt and let her know that we received her cpap download and per [facility] NP it showed excellent compliance.  Pt was told to continue using her cpap and she has yearly f/u next July.  She verbalized understanding.

## 2015-03-24 NOTE — Telephone Encounter (Signed)
This patient has obstructive sleep apnea on CPAP. We received a download. Her download indicates that she used her machine 30 out of 30 days for compliance of 100%. On average she uses her machine 7 hours and 43 minutes. She used her machine greater than 4 hours 30 out of 30 days for compliance of 100%. Her residual AHI is 0.8 on 9 cm of pressure.  This is an excellent download showing excellent compliance. She is encouraged to continue using CPAP nightly. Please call the patient and make her aware.

## 2016-01-23 ENCOUNTER — Ambulatory Visit (INDEPENDENT_AMBULATORY_CARE_PROVIDER_SITE_OTHER): Payer: BLUE CROSS/BLUE SHIELD | Admitting: Adult Health

## 2016-01-23 ENCOUNTER — Encounter: Payer: Self-pay | Admitting: Adult Health

## 2016-01-23 VITALS — BP 111/72 | HR 78 | Resp 16 | Ht 66.0 in | Wt 256.0 lb

## 2016-01-23 DIAGNOSIS — G4733 Obstructive sleep apnea (adult) (pediatric): Secondary | ICD-10-CM | POA: Diagnosis not present

## 2016-01-23 DIAGNOSIS — Z9989 Dependence on other enabling machines and devices: Principal | ICD-10-CM

## 2016-01-23 NOTE — Progress Notes (Signed)
PATIENT: Natasha Lee DOB: Oct 02, 1960  REASON FOR VISIT: follow up- OSA on cpap HISTORY FROM: patient  HISTORY OF PRESENT ILLNESS: Natasha Lee is a 55 year old female with a history of obstructive sleep apnea on CPAP. She returns today for a compliance download. She states that her machine has been malfunctioning. She is to continue her DME and she does qualify for a new machine. She states her machine can be fixed however this can recall sleep. Her download indicates that she uses her machine each night for compliance of 100%. Every night she uses her machine greater than 4 hours. Her residual AHI is 0.2 on 9 cm of water. Patient states that the machine is working well for her sleepiness. She denies any new neurological symptoms. She returns today for an evaluation.  HISTORY 01/19/15 (MM): Natasha Lee is a [age] year old female with a history of obstructive sleep apnea on CPAP. She returns today for follow-up. The patient states that she did have a memory card however she sent it to her DME company and never got her card back. She states that she did call the company and they are sending her a new one. She states that she has continued to use her CPAP nightly. She goes to bed between 9 and 10 PM and arises at 6 AM. She usually gets up one time to urinate. Her Epworth sleepiness score is 3 and fatigue severity score is 36. Overall patient feels that the CPAP has been very beneficial for her. She returns today for an evaluation.  HISTORY 01/06/14 (CD): Natasha Lee is here today for her yearly revisit. 01-06-14 This is also meant to establish her CPAP compliance. The patient has been on CPAP 28 month had been followed by [facility]. AHI has been 0.8 /hr. at a CPAP pressure of 9 cm water.  The average time of CPAP use at night to 6 hours and 27 minutes and she has 100% compliance for 30 day download. She has brought her machine to this visit.  The patient endorsed fatigue severity score at 26 points and the  Epworth score at 3 points.    Prior to CPAP, the sleepiness score was endorsed at 19 points and her fatigue severity was extremely high as well. She goes to bed between 9 and 10 PM, goes to sleep promptly. Rises at 4.30 AM, she has 2 dogs to walk. Her husband rises an hour later. She drinks no coffee in the morning , neither any other form of caffeine.  She does not nap in the afternoon. She wakes restored, refreshed, without headaches or dry mouth.   She gained weight from menopause. Hot flashes still present, but not interrupting her sleep as much.   REVIEW OF SYSTEMS: Out of a complete 14 system review of symptoms, the patient complains only of the following symptoms, and all other reviewed systems are negative.  See history of present illness  ALLERGIES: No Known Allergies  HOME MEDICATIONS: Outpatient Prescriptions Prior to Visit  Medication Sig Dispense Refill  . BucAlfAspKGlucCouchParsUvaUrJu (WATER PILLS PO) Take by mouth daily.    . Esomeprazole Magnesium (NEXIUM PO) Take by mouth 2 (two) times daily.     No facility-administered medications prior to visit.    PAST MEDICAL HISTORY: Past Medical History  Diagnosis Date  . Vertigo   . Menopausal symptoms   . Obstructive apnea     sleep study 07-20-11 , AHI 61.6- OSA  9 cm CPAP.   Marland Kitchen. Menopause syndrome  PAST SURGICAL HISTORY: Past Surgical History  Procedure Laterality Date  . Tubal ligation  1992    FAMILY HISTORY: Family History  Problem Relation Age of Onset  . Hypertension Father     SOCIAL HISTORY: Social History   Social History  . Marital Status: Married    Spouse Name: Casimiro Needle  . Number of Children: 2  . Years of Education: 12   Occupational History  .  [employer]   Social History Main Topics  . Smoking status: Never Smoker   . Smokeless tobacco: Never Used  . Alcohol Use: No  . Drug Use: No  . Sexual Activity: Not on file   Other Topics Concern  . Not on file   Social  History Narrative   Patient is married Casimiro Needle ) and lives at home with her husband and one of her children.   Patient has two adult children.   Patient is working full-time.   Patient has a high school education.   Patient is right-handed.   Patient does not drink any caffeine.      PHYSICAL EXAM  Filed Vitals:   01/23/16 0726  BP: 111/72  Pulse: 78  Resp: 16  Height:  (1.676 m)  Weight: 256 lb (116.121 kg)   Body mass index is 41.34 kg/(m^2).  Generalized: Well developed, in no acute distress  Neck: Circumference 17 inches, Mallampati 4+  Neurological examination  Mentation: Alert oriented to time, place, history taking. Follows all commands speech and language fluent Cranial nerve II-XII: Pupils were equal round reactive to light. Extraocular movements were full, visual field were full on confrontational test. Facial sensation and strength were normal. Uvula tongue midline. Head turning and shoulder shrug  were normal and symmetric. Motor: The motor testing reveals 5 over 5 strength of all 4 extremities. Good symmetric motor tone is noted throughout.  Sensory: Sensory testing is intact to soft touch on all 4 extremities. No evidence of extinction is noted.  Coordination: Cerebellar testing reveals good finger-nose-finger and heel-to-shin bilaterally.  Gait and station: Gait is normal. Tandem gait is normal. Romberg is negative. No drift is seen.  Reflexes: Deep tendon reflexes are symmetric and normal bilaterally.   DIAGNOSTIC DATA (LABS, IMAGING, TESTING) - I reviewed patient records, labs, notes, testing and imaging myself where available.     ASSESSMENT AND PLAN 55 y.o. year old female  has a past medical history of Vertigo; Menopausal symptoms; Obstructive apnea; and Menopause syndrome. here with:  1. Obstructive sleep apnea on CPAP  The patient's download is excellent. I have provided her with a new prescription for a new machine. Patient advised that if  her symptoms worsen or she develops any new symptoms she she'll let us know. She will follow-up in one year with Dr. Vergia Alcon, MSN, NP-C 01/23/2016, 7:31 AM [facility] [address], [address] [place], [place] [address] [phone]

## 2016-01-23 NOTE — Progress Notes (Signed)
I agree with the assessment and plan as directed by NP .The patient is known to me .   Arcenio Mullaly, MD  

## 2016-01-23 NOTE — Patient Instructions (Signed)
New prescription for new CPAP machine If your symptoms worsen or you develop new symptoms please let us know.

## 2016-02-26 ENCOUNTER — Encounter: Payer: Self-pay | Admitting: Neurology

## 2016-04-25 ENCOUNTER — Telehealth: Payer: Self-pay | Admitting: Adult Health

## 2016-04-25 NOTE — Telephone Encounter (Signed)
I received the patient's compliance download. It indicates that she uses her machine 30 out of 30 days for compliance of 100%. Her average use is a 7 hours and 25 minutes. Each night she uses her machine greater than 4 hours. Her residual AHI is 0.4 on 9 cm of water with EPR of 1. She does not have a significant leak. This is a Engineer, maintenancegreat download.

## 2016-04-25 NOTE — Telephone Encounter (Signed)
LMVM (mobile) for pt that her cpap download was great.  No significant leak.  Pt is to call back if questions. (ok per DPR )

## 2016-10-05 ENCOUNTER — Other Ambulatory Visit (HOSPITAL_COMMUNITY): Payer: Self-pay | Admitting: Surgery

## 2016-10-18 ENCOUNTER — Ambulatory Visit (HOSPITAL_COMMUNITY): Payer: Self-pay

## 2016-10-18 ENCOUNTER — Other Ambulatory Visit (HOSPITAL_COMMUNITY): Payer: Self-pay

## 2016-10-18 ENCOUNTER — Encounter (HOSPITAL_COMMUNITY): Payer: Self-pay

## 2016-10-24 ENCOUNTER — Encounter: Payer: BLUE CROSS/BLUE SHIELD | Attending: Surgery | Admitting: Registered"

## 2016-10-24 ENCOUNTER — Encounter: Payer: Self-pay | Admitting: Registered"

## 2016-10-24 DIAGNOSIS — Z6841 Body Mass Index (BMI) 40.0 and over, adult: Secondary | ICD-10-CM | POA: Insufficient documentation

## 2016-10-24 DIAGNOSIS — Z713 Dietary counseling and surveillance: Secondary | ICD-10-CM | POA: Insufficient documentation

## 2016-10-24 DIAGNOSIS — G4733 Obstructive sleep apnea (adult) (pediatric): Secondary | ICD-10-CM | POA: Diagnosis not present

## 2016-10-24 DIAGNOSIS — E669 Obesity, unspecified: Secondary | ICD-10-CM

## 2016-10-24 NOTE — Patient Instructions (Addendum)
-   Increase vegetable intake during the week with vegetables that you enjoy. Snack on fresh vegetables such as: peppers with hummus, carrots, broccoli while working  - Increase physical activity to walk at least 20-30 min a day for 3 days a week  - Consume 3 meals per day; eat every 3-5 hours  - Aim for 64-100 ounces of FLUID daily

## 2016-10-24 NOTE — Progress Notes (Signed)
Pre-Op Assessment Visit:  Pre-Operative Sleeve Gastrectomy Surgery  Medical Nutrition Therapy:  Appt start time: 2:00  End time:  2:53  Patient was seen on 10/24/2016 for Pre-Operative Nutrition Assessment. Assessment.   Pt expectation of surgery: wanting to lose weight and be proactive about health  Pt expectation of Dietitian:   Start weight at [facility]: 261 BMI: 42.77  Pt stated she was diagnosed with sever ulcers due to taking too many Goodie powders. Pt reports she used to have swelling in ankles during menopause. Pt reports Used to take water pills. Works at home and sits throughout the day. Pt states she takes line dancing 3 times a wk for about 90 min each. Pt states she also does yard work as physical activity. Pt's husband has Afib, they follow a heart healthy diet. Pt reports she does not fry food & consumes small amounts of red meat. Pt states that she has eliminated soda, tea, and coffee from diet due to ulcers. Pt states that she is unable to eat and drink throughout the day due to being busy with work.    Pt states that she needs 6 months of visits prior to surgery date.   24 hr Dietary Recall: First Meal: 2 boiled eggs, toast Snack: none Second Meal: 1 meal replacement Snack: 1 meal replacement Third Meal: pork chops, chicken, chicken noodle soup Snack: none Beverages: skim milk, flavored water, Gatorade, Ensure  Encouraged to engage in  minutes of moderate physical activity including cardiovascular and weight baring weekly  Handouts given during visit include:  . Pre-Op Goals . Bariatric Surgery Protein Shakes During the appointment today the following Pre-Op Goals were reviewed with the patient: . Maintain or lose weight as instructed by your surgeon . Make healthy food choices . Begin to limit portion sizes . Limited concentrated sugars and fried foods . Keep fat/sugar in the single digits per serving on          food labels . Practice CHEWING your food  (aim for  30 chews per bite or until applesauce consistency) . Practice not drinking 15 minutes before, during, and 30 minutes after each meal/snack . Avoid all carbonated beverages  . Avoid/limit caffeinated beverages  . Avoid all sugar-sweetened beverages . Consume 3 meals per day; eat every 3-5 hours . Make a list of non-food related activities . Aim for 64-100 ounces of FLUID daily  . Aim for at least 60-80 grams of PROTEIN daily . Look for a liquid protein source that contain ?15 g protein and ?5 g carbohydrate  (ex: shakes, drinks, shots)  -Follow diet recommendations listed below   Energy and Macronutrient Recomendations: Calories: 1600  Carbohydrate: 180 Protein: 120 Fat: 44  Demonstrated degree of understanding via:  Teach Back  Teaching Method Utilized:  Visual Auditory Hands on  Barriers to learning/adherence to lifestyle change: work-life balance  Patient to call the [facility] to enroll in Pre-Op and Post-Op Nutrition Education when surgery date is scheduled.

## 2016-11-22 ENCOUNTER — Encounter: Payer: Self-pay | Admitting: Registered"

## 2016-11-22 ENCOUNTER — Encounter: Payer: BLUE CROSS/BLUE SHIELD | Attending: Surgery | Admitting: Registered"

## 2016-11-22 DIAGNOSIS — G4733 Obstructive sleep apnea (adult) (pediatric): Secondary | ICD-10-CM | POA: Diagnosis not present

## 2016-11-22 DIAGNOSIS — Z6841 Body Mass Index (BMI) 40.0 and over, adult: Secondary | ICD-10-CM | POA: Diagnosis not present

## 2016-11-22 DIAGNOSIS — Z713 Dietary counseling and surveillance: Secondary | ICD-10-CM | POA: Insufficient documentation

## 2016-11-22 DIAGNOSIS — E669 Obesity, unspecified: Secondary | ICD-10-CM

## 2016-11-22 NOTE — Progress Notes (Signed)
Appt start time: 4:50 end time:  5:15 Assessment: 1st SWL Appointment.   Start Wt at [facility]: 261.0 Wt: 256.1 BMI: 41.97   Pt arrives having lost 4.9 lbs from previous visit. Pt states she has been drinking vanilla Atkins protein shakes. Pt states she drinks 4-5 bottles (16.9oz) of water a day. Pt reports being more intentional about prepping bags of fruit at night as well as keeping snacks easily accessible during the workday. Pt states walking outside has been limited due to pollen. 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 Pt states her weight gain is due to menopause and not eating enough.   Preferred Learning Style:   No preference indicated   Learning Readiness:   Ready  Change in progress  MEDICATIONS: See list   DIETARY INTAKE:  24-hr recall:  B ( AM): boiled eggs, toast  Snk ( AM): greek yogurt  L ( PM): salad with grilled chicken Snk ( PM): pretzels with hummus, fresh fruit, protein shake D ( PM): chicken or pork chops, green beans or corn or pinto beans  Snk ( PM): none Beverages: water, shakes, skim milk, gatorade  Usual physical activity: line dancing and walking   Diet to Follow: Calories: 1600  Carbohydrate: 180 Protein: 120 Fat: 44   Nutritional Diagnosis:  -3.3 Overweight/obesity related to past poor dietary habits and physical inactivity as evidenced by patient w/ upcoming sleeve gastrectomy surgery following dietary guidelines for continued weight loss.    Intervention:  Nutrition counseling for upcoming Bariatric Surgery.  Goals:  - Find a variety of liquid protein shakes that you enjoy. - Aim to get in at least 30 min of physical activity 5 days a week. Do arm exercises at home when unable to get outside and walk.  - Use G2 or Powerade Zero as alternatives to regular Gatorade and Powerade.   Teaching Method Utilized:  Visual Auditory Hands on  Handouts given during visit  include:  none   Barriers to learning/adherence to lifestyle change: none  Demonstrated degree of understanding via:  Teach Back   Monitoring/Evaluation:  Dietary intake, exercise, and body weight in 1 month(s).

## 2016-11-22 NOTE — Patient Instructions (Addendum)
-   Find a variety of liquid protein shakes that you enjoy.  - Aim to get in at least 30 min of physical activity 5 days a week. Do arm exercises at home when unable to get outside and walk.   - Use G2 or Powerade Zero as alternatives to regular Gatorade and Powerade.

## 2016-12-27 ENCOUNTER — Encounter: Payer: BLUE CROSS/BLUE SHIELD | Attending: Surgery | Admitting: Skilled Nursing Facility1

## 2016-12-27 ENCOUNTER — Encounter: Payer: Self-pay | Admitting: Skilled Nursing Facility1

## 2016-12-27 DIAGNOSIS — E669 Obesity, unspecified: Secondary | ICD-10-CM

## 2016-12-27 DIAGNOSIS — Z713 Dietary counseling and surveillance: Secondary | ICD-10-CM | POA: Insufficient documentation

## 2016-12-27 DIAGNOSIS — G4733 Obstructive sleep apnea (adult) (pediatric): Secondary | ICD-10-CM | POA: Insufficient documentation

## 2016-12-27 DIAGNOSIS — Z6841 Body Mass Index (BMI) 40.0 and over, adult: Secondary | ICD-10-CM | POA: Insufficient documentation

## 2016-12-27 NOTE — Progress Notes (Signed)
Appt start time: 4:50 end time:  5:15 Assessment: 2nd SWL Appointment.   Start Wt at [facility]: 261.0 Wt: 253.4 BMI: 40.88  Likes to be called Natasha Lee. Pt states she enjoys the premier protein shakes. Pt staese she has been working on arm and legs exercises while working at home. Pt states she has tried Gatorade G2 and likes them. Pt states her husband had a mild heat stroke last week. 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 Pt states she only drinks waters and gatorade and skim milks due to ulcers in her stomach. Pt states she has to make these dietary changes for the health of her husband. Pt states she has been avoiding drinking meals and chewing wells. Pt states she tries to not eat past 6pm. Pt states she has been working on eating throughout the day. Pt states he was craving sweets due to the prednisone shot. Pt states she really does not want to have diabetes.   Preferred Learning Style:   No preference indicated   Learning Readiness:   Ready  Change in progress  MEDICATIONS: See list   DIETARY INTAKE:  24-hr recall:  B ( AM): boiled eggs, toast  Snk ( AM): greek yogurt  L ( PM): salad with grilled chicken Snk ( PM): pretzels with hummus, fresh fruit, protein shake D ( PM): chicken or pork chops, green beans or corn or pinto beans  Snk ( PM): none Beverages: water, shakes, skim milk, gatorade  Usual physical activity: line dancing and walking   Diet to Follow: Calories: 1600  Carbohydrate: 180 Protein: 120 Fat: 44   Nutritional Diagnosis:  [place]-3.3 Overweight/obesity related to past poor dietary habits and physical inactivity as evidenced by patient w/ upcoming sleeve gastrectomy surgery following dietary guidelines for continued weight loss.    Intervention:  Nutrition counseling for upcoming Bariatric Surgery.  Goals:  - Aim to get in at least 30 min of physical activity 5 days a week. Do arm exercises at home when unable to get outside and walk.  -Keep  incorporating more snacks and eating throughout the day  -Keep working on getting in plenty of water: keep something on your desk  Teaching Method Utilized:  Visual Auditory Hands on  Handouts given during visit include:  none   Barriers to learning/adherence to lifestyle change: none  Demonstrated degree of understanding via:  Teach Back   Monitoring/Evaluation:  Dietary intake, exercise, and body weight in 1 month(s).

## 2017-01-21 ENCOUNTER — Encounter: Payer: Self-pay | Admitting: Neurology

## 2017-01-23 ENCOUNTER — Other Ambulatory Visit: Payer: Self-pay

## 2017-01-23 ENCOUNTER — Encounter: Payer: Self-pay | Admitting: Neurology

## 2017-01-23 ENCOUNTER — Ambulatory Visit (HOSPITAL_COMMUNITY)
Admission: RE | Admit: 2017-01-23 | Discharge: 2017-01-23 | Disposition: A | Discharge status: Home / Self Care | Payer: BLUE CROSS/BLUE SHIELD | Source: Ambulatory Visit | Attending: Surgery | Admitting: Surgery

## 2017-01-23 ENCOUNTER — Ambulatory Visit (INDEPENDENT_AMBULATORY_CARE_PROVIDER_SITE_OTHER): Payer: BLUE CROSS/BLUE SHIELD | Admitting: Neurology

## 2017-01-23 DIAGNOSIS — N951 Menopausal and female climacteric states: Secondary | ICD-10-CM

## 2017-01-23 DIAGNOSIS — Z8711 Personal history of peptic ulcer disease: Secondary | ICD-10-CM | POA: Insufficient documentation

## 2017-01-23 DIAGNOSIS — Z9989 Dependence on other enabling machines and devices: Secondary | ICD-10-CM | POA: Diagnosis not present

## 2017-01-23 DIAGNOSIS — K449 Diaphragmatic hernia without obstruction or gangrene: Secondary | ICD-10-CM | POA: Insufficient documentation

## 2017-01-23 DIAGNOSIS — G4733 Obstructive sleep apnea (adult) (pediatric): Secondary | ICD-10-CM

## 2017-01-23 DIAGNOSIS — K219 Gastro-esophageal reflux disease without esophagitis: Secondary | ICD-10-CM | POA: Diagnosis not present

## 2017-01-23 NOTE — Patient Instructions (Signed)
Bariatric Surgery Information Bariatric surgery, also called weight loss surgery, is a procedure that helps you lose weight. You may consider or your health care provider may suggest bariatric surgery if:  You are severely obese and have been unable to lose weight through diet and exercise.  You have health problems related to obesity, such as: ? Type 2 diabetes. ? Heart disease. ? Lung disease.  How does bariatric surgery help me lose weight? Bariatric surgery helps you lose weight by decreasing how much food your body absorbs. This is done by closing off part of your stomach to make it smaller. This restricts the amount of food your stomach can hold. Bariatric surgery can also change your body's regular digestive process, so that food bypasses the parts of your body that absorb calories and nutrients. If you decide to have bariatric surgery, it is important to continue to eat a healthy diet and exercise regularly after the surgery. What are the different kinds of bariatric surgery? There are two kinds of bariatric surgeries:  Restrictive surgeries make your stomach smaller. They do not change your digestive process. The smaller the size of your new stomach, the less food you can eat. There are different types of restrictive surgeries.  Malabsorptive surgeries both make your stomach smaller and alter your digestive process so that your body processes less calories and nutrients. These are the most common kind of bariatric surgery. There are different types of malabsorptive surgeries.  What are the different types of restrictive surgery? Adjustable Gastric Banding In this procedure, an inflatable band is placed around your stomach near the upper end. This makes the passageway for food into the rest of your stomach much smaller. The band can be adjusted, making it tighter or looser, by filling it with salt solution. Your surgeon can adjust the band based on how are you feeling and how much  weight you are losing. The band can be removed in the future. Vertical Banded Gastroplasty In this procedure, staples are used to separate your stomach into two parts, a small upper pouch and a bigger lower pouch. This decreases how much food you can eat. Sleeve Gastrectomy In this procedure, your stomach is made smaller. This is done by surgically removing a large part of your stomach. When your stomach is smaller, you feel full more quickly and reduce how much you eat. What are the different types of malabsorptive surgery? Roux-en-Y Gastric Bypass (RGB) This is the most common weight loss surgery. In this procedure, a small stomach pouch is created in the upper part of your stomach. Next, this small stomach pouch is attached directly to the middle part of your small intestine. The farther down your small intestine the new connection is made, the fewer calories and nutrients you will absorb. Biliopancreatic Diversion with Duodenal Switch (BPD/DS) This is a multi-step procedure. In this procedure, a large part of your stomach is removed, making your stomach smaller. Next, this smaller stomach is attached to the lower part of your small intestine. Like the RGB surgery, you absorb fewer calories and nutrients the farther down your small intestine the attachment is made. What are the risks of bariatric surgery? As with any surgical procedure, each type of bariatric surgery has its own risks. These risks also depend on your age, your overall health, and any other medical conditions you may have. When deciding on bariatric surgery, it is very important to:  Talk to your health care provider and choose the surgery that is best for   you.  Ask your health care provider about specific risks for the surgery you choose.  Where to find more information:  American Society for Metabolic & Bariatric Surgery: www.asmbs.org  Weight-control Information Network (WIN): win.niddk.nih.gov This information is not  intended to replace advice given to you by your health care provider. Make sure you discuss any questions you have with your health care provider. Document Released: 06/25/2005 Document Revised: 12/01/2015 Document Reviewed: 12/24/2012 Elsevier Interactive Patient Education  2017 Elsevier Inc.  

## 2017-01-23 NOTE — Progress Notes (Signed)
[facility]  Provider:  Dr Keyshawna Prouse Referring Provider: Harvie Heck, MD Primary Care Physician:  Harvie Heck, MD  Chief Complaint  Patient presents with  . Follow-up    HPI: Interval history, Natasha Lee is seen here today on 01/23/2017. Her last 2 visits with my nurse practitioner. The patient has been followed in the sleep clinic for 5 years. She has been a compliant CPAP user each year her compliance has been over 90%, this year 97% with an average user time of 7 hours and 26 minutes, CPAP is set at 9 cm water with 1 cm EPR she has a residual AHI of only 0.6 per hour minimal air leaks. She declares her love for her CPAP machine.  She is neither daytime sleepy nor fatigued.  Natasha Lee is seeing Dr. Luretha Murphy and is in the midst of a workup for bariatric surgery.  Over the last 3 years her weight has been stable but she did cane weight over the last 8 years in total, at this time without significant comorbidity. She is neither diabetic nor hypertensive. She is interested in a gastric sleeve surgery. I explained to her that now that her CPAP is about 63.55 year old we would be able to change her to an auto titration machine at the time of surgery, the machine will allow her to find the best pressure for her likely fast decreasing body weight. The surgery will not be before November 2018. I suggest that Natasha Lee will make a follow-up appointment around the time of surgery that we will be able to give her a CPAP machine with auto-titration. She is still followed by [facility] (formerly by [facility]), now in [place], [place] .   Natasha Lee is here today for her yearly revisit. 01-06-14. This is also meant to establish her CPAP compliance. The patient has been on  CPAP 28 month had been followed by [facility]. AHI has been 0.8 /hr. at a CPAP pressure of 9 cm water.  The average time of CPAP use at night to 6 hours and 27 minutes and she has 100% compliance for 30 day download. She  has brought her machine to this visit.  The patient endorsed fatigue severity score at 26 points and the Epworth score at 3 points.   Prior to CPAP, the sleepiness score was endorsed at 19 points and her fatigue severity was extremely high as well. She goes to bed between 9 and 10 PM, goes to sleep promptly. Rises at 4.30 AM, she has 2 dogs to walk. Her husband rises an hour later. She drinks no coffee in the morning , neither any other form of caffeine.  She does not nap in the afternoon.  She wakes restored, refreshed, without headaches or dry mouth.  She gained weight "from menopause on". Hot flashes still present, but not interrupting her sleep as much.    PMHX: last visit 2014 :  Natasha Lee is a 56 y.o. female here as a referral from Dr. Larita Fife  MD. Natasha Lee was originally referred at the end of 2012 directly for a sleep study,  sleep study was performed on 1-11- 2013 and turned into a split study.  She had presented with a past medical history of morbid obesity,  a weight gain of approximately 50 pounds within the past year prior to study , and hypertension, ankle edema.  The patient presented with multiple sleep complaints:  including loud snoring , witnessed apneas, frequent nocturia, unrefreshing non-restorative sleep.  The patient also had excessive daytime hypersomnolence reflected in the pre-study Epworth Sleepiness Scale and Natasha Lee at 19 points. Beck Depression Inventory was endorsed at 22 points. Her BMI at the time of the study was 37. Neck circumference 17 inches.  The patient's bedtime had been 10 PM she geot up at 4:30 AM may have one or 4-5 bathroom breaks at night. 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 She changed in2008 to first shift and started gaining weight rapidly.    She was titrated to 9 cm water and  reached an AHI of 0 during that sleep study in 2013 - felt immediately better. Chose a Nasal pillow.Pilairo model .  A download at and involving [facility] shows an average residual AHI of 1.0 at CPAP pressure of 9 cm and 100% compliance for 90 days.  The patient has been very happy with her CPAP if faxed she said that she cannot easily go to sleep without it and feels that she certainly would never be as restored and she feels now in the morning without adhering to CPAP therapy she endorsed today her sleepiness score at 8 points.  Prior to CPAP the patient had nocturia and frequent bathroom breaks, she now has one or 2 bathroom break at the moles to on CPAP she also takes her furosemide in the morning which was initiated to combat ankle edema.This medication change took place after the sleep study.   Med history : Patient  has no history of nasal septal deviation or facial oral surgeries that may have affected the upper airway. She never had a tonsillectomy.  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  Her father  ( a Investment banker, operational) became a dialysis patient in his last years of life, had diabetes and diabetic related renal failure as well as high blood pressure.He had apnea but was not treated . Both grandmothers insulin dependent diabeticMaternal grandmother had  HTN, DM and renal failure. These family members were not overweight.  She has a third shift work history , until 10 years ago. 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      Review of Systems: Out of a complete 14 system review, the patient complains of only the following symptoms, and all other reviewed systems are negative.  intended weight loss, Epworth 9 from 18-19 points pre CPAP. FSS 45.   Social History   Social History  . Marital status: Married    Spouse name: Natasha Lee  . Number of children: 2  . Years of  education: 12   Occupational History  .  [employer]   Social History Main Topics  . Smoking status: Never Smoker  . Smokeless tobacco: Never Used  . Alcohol use No  . Drug use: No  . Sexual activity: Not on file   Other Topics Concern  . Not on file   Social History Narrative   Patient is married Natasha Lee ) and lives at home with her husband and one of her children.   Patient has two adult children.   Patient is working full-time.   Patient has a high school education.   Patient is right-handed.   Patient does not drink any  caffeine.    Family History  Problem Relation Age of Onset  . Hypertension Father   . Diabetes Other     Past Medical History:  Diagnosis Date  . Menopausal symptoms   . Menopause syndrome   . Obstructive apnea    sleep study 07-20-11 , AHI 61.6- OSA  9 cm CPAP.   Marland Kitchen. Vertigo     Past Surgical History:  Procedure Laterality Date  . TUBAL LIGATION  1992    Current Outpatient Prescriptions  Medication Sig Dispense Refill  . BucAlfAspKGlucCouchParsUvaUrJu (WATER PILLS PO) Take by mouth daily.    . Esomeprazole Magnesium (NEXIUM PO) Take by mouth 2 (two) times daily.     No current facility-administered medications for this visit.     Allergies as of 01/23/2017  . (No Known Allergies)    Vitals: BP 104/70   Pulse 76   Ht 5\' 7"  (1.702 m)   Wt 257 lb (116.6 kg)   BMI 40.25 kg/m  Last Weight:  Wt Readings from Last 1 Encounters:  01/23/17 257 lb (116.6 kg)   Last Height:   Ht Readings from Last 1 Encounters:  01/23/17 5\' 7"  (1.702 m)   Physical exam:  General: The patient is awake, alert and appears not in acute distress. The patient is well groomed. Head: Normocephalic, atraumatic. Neck is supple. Mallampati highest grade, uvula is poorly visible. , neck circumference:17.5 inches .  Cardiovascular:  Regular rate and rhythm, without  murmurs or carotid bruit, and without distended neck veins. Respiratory: Lungs are clear to  auscultation. Skin:  Without evidence of edema, or rash Trunk: BMI is elevated to morbid obesity , patient  has normal posture. Neurologic exam :The patient is awake and alert, oriented to place and time. Memory subjective described as intact.  There is a normal attention span & concentration ability.  Speech is fluent without  dysarthria, dysphonia or aphasia. Mood and affect are appropriate. Cranial nerves:Pupils are equal and briskly reactive to light. Funduscopic exam without  evidence of pallor or edema.  Extraocular movements in vertical and horizontal planes intact and without nystagmus. Visual fields by finger perimetry are intact. Hearing to finger rub intact.  Facial sensation intact to fine touch. Facial motor strength is symmetric and tongue and uvula move midline. Motor exam:   Normal tone and normal muscle bulk and symmetric normal strength in all extremities. Sensory:  Fine touch, pinprick and vibration were intact -Proprioception is normal  Coordination: Rapid alternating movements in the fingers/hands is normal.  Finger-to-nose maneuver without evidence of ataxia, dysmetria or tremor. Gait and station: Patient walks without assistive device  Deep tendon reflexes: in the  upper and lower extremities are symmetric and intact.   Assessment/ Plan:    She remains 97% compliant with CPAP and has benefited greatly- her Epworth has been reduced by 10 points, she has no longer nocturia, she has no longer ankle edema .  She is very motivated to continue it's use. I will have another download of CPAP yearly. The patient is well informed about her condition. She realizes that her main risk factor is the status of morbid obesity-superobesity. She now fulfills criteria for bariatric surgery and I would definitely encourage her to go this route. She has worked with a nutritionist to start healthy or healthy year dietary habits. If she indeed proceeds to have her surgery in November I would like  for her to come back for a visit and we will try to set her current  machine to an auto titration. If not possible may exchange for an auto-titrator. DME [facility].

## 2017-01-29 ENCOUNTER — Encounter: Payer: BLUE CROSS/BLUE SHIELD | Attending: Surgery | Admitting: Skilled Nursing Facility1

## 2017-01-29 ENCOUNTER — Encounter: Payer: Self-pay | Admitting: Skilled Nursing Facility1

## 2017-01-29 DIAGNOSIS — G4733 Obstructive sleep apnea (adult) (pediatric): Secondary | ICD-10-CM | POA: Diagnosis not present

## 2017-01-29 DIAGNOSIS — Z6841 Body Mass Index (BMI) 40.0 and over, adult: Secondary | ICD-10-CM | POA: Insufficient documentation

## 2017-01-29 DIAGNOSIS — E669 Obesity, unspecified: Secondary | ICD-10-CM

## 2017-01-29 DIAGNOSIS — Z713 Dietary counseling and surveillance: Secondary | ICD-10-CM | POA: Diagnosis not present

## 2017-01-29 NOTE — Progress Notes (Signed)
Appt start time: 4:50 end time:  5:15 Assessment: 3rd SWL Appointment.   Start Wt at [facility]: 261.0 Wt: 256 BMI: 41.95  Likes to be called Debby. Pt states she enjoys the premier protein shakes. Pt states she has been working on arm and legs exercises while working at home. Pt states her husband had a mild heat stroke last week. 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 Pt states she only drinks waters and gatorade and skim milks due to ulcers in her stomach. Pt states she has to make these dietary changes for the health of her husband. Pt states she has been avoiding drinking meals and chewing well. Pt states she tries to not eat past 6pm. Pt states she has been working on eating throughout the day.  Pt arrives having gained about 3 pounds. Pt states she has been doing good about drinking enough water and keeping water at her desk. Pt states she thinks she is going to be very successful after surgery.    Preferred Learning Style:   No preference indicated   Learning Readiness:   Ready  Change in progress  MEDICATIONS: See list   DIETARY INTAKE:  24-hr recall:  B ( AM): boiled eggs, toast  Snk ( AM): greek yogurt or fruit L ( PM): yogurt and carrot stick and protein shake Snk ( PM): pretzels with hummus, fresh fruit, protein shake D ( PM): chicken or pork chops, green beans or corn or pinto beans  Snk ( PM): none Beverages: water, premier shakes, skim milk, gatorade  Usual physical activity: line dancing and walking   Diet to Follow: Calories: 1600  Carbohydrate: 180 Protein: 120 Fat: 44   Nutritional Diagnosis:  [place]-3.3 Overweight/obesity related to past poor dietary habits and physical inactivity as evidenced by patient w/ upcoming sleeve gastrectomy surgery following dietary guidelines for continued weight loss.    Intervention:  Nutrition counseling for upcoming Bariatric Surgery.  Goals:  - Aim to get in at least 30 min of physical activity 5 days a week.  Do arm exercises at home when unable to get outside and walk.  -Keep up being consistent with all your changes  Teaching Method Utilized:  Visual Auditory Hands on  Handouts given during visit include:  none   Barriers to learning/adherence to lifestyle change: none  Demonstrated degree of understanding via:  Teach Back   Monitoring/Evaluation:  Dietary intake, exercise, and body weight in 1 month(s).

## 2017-02-28 ENCOUNTER — Encounter: Payer: BLUE CROSS/BLUE SHIELD | Attending: Surgery | Admitting: Skilled Nursing Facility1

## 2017-02-28 ENCOUNTER — Encounter: Payer: Self-pay | Admitting: Skilled Nursing Facility1

## 2017-02-28 DIAGNOSIS — G4733 Obstructive sleep apnea (adult) (pediatric): Secondary | ICD-10-CM | POA: Insufficient documentation

## 2017-02-28 DIAGNOSIS — Z6841 Body Mass Index (BMI) 40.0 and over, adult: Secondary | ICD-10-CM | POA: Insufficient documentation

## 2017-02-28 DIAGNOSIS — E669 Obesity, unspecified: Secondary | ICD-10-CM

## 2017-02-28 DIAGNOSIS — Z713 Dietary counseling and surveillance: Secondary | ICD-10-CM | POA: Diagnosis not present

## 2017-02-28 NOTE — Progress Notes (Signed)
Appt start time: 4:50 end time:  5:15 Assessment: 4th SWL Appointment.   Start Wt at [facility]: 261.0 Wt: 260 BMI: 42.67  Likes to be called Natasha Lee. Pt states she thinks she is going to be very successful after surgery.  Pt arrives having gained about 4 pounds. Pt states she only needs one more visit.    Preferred Learning Style:   No preference indicated   Learning Readiness:   Ready  Change in progress  MEDICATIONS: See list   DIETARY INTAKE:  24-hr recall:  B ( AM): boiled eggs, toast---oatmeal  Snk ( AM): greek yogurt or fruit L ( 12:15PM): tuna sandwich Snk ( PM): protein shake D ( PM): chicken or pork chops, green beans or corn or pinto beans  Snk ( PM): none Beverages: water, premier shakes, skim milk, gatorade  Usual physical activity: line dancing and walking   Diet to Follow: Calories: 1600  Carbohydrate: 180 Protein: 120 Fat: 44   Nutritional Diagnosis:  [place]-3.3 Overweight/obesity related to past poor dietary habits and physical inactivity as evidenced by patient w/ upcoming sleeve gastrectomy surgery following dietary guidelines for continued weight loss.    Intervention:  Nutrition counseling for upcoming Bariatric Surgery.  Goals:  - Aim to get in at least 30 min of physical activity 5 days a week. Do arm exercises at home when unable to get outside and walk.  -Keep up being consistent with all your changes -Aim for 2 water bottles a day  Teaching Method Utilized:  Visual Auditory Hands on  Handouts given during visit include:  none   Barriers to learning/adherence to lifestyle change: none  Demonstrated degree of understanding via:  Teach Back   Monitoring/Evaluation:  Dietary intake, exercise, and body weight in 1 month(s).

## 2017-04-02 ENCOUNTER — Encounter: Payer: Self-pay | Admitting: Skilled Nursing Facility1

## 2017-04-02 ENCOUNTER — Encounter: Payer: BLUE CROSS/BLUE SHIELD | Attending: Surgery | Admitting: Skilled Nursing Facility1

## 2017-04-02 DIAGNOSIS — Z713 Dietary counseling and surveillance: Secondary | ICD-10-CM | POA: Insufficient documentation

## 2017-04-02 DIAGNOSIS — E669 Obesity, unspecified: Secondary | ICD-10-CM

## 2017-04-02 DIAGNOSIS — G4733 Obstructive sleep apnea (adult) (pediatric): Secondary | ICD-10-CM | POA: Insufficient documentation

## 2017-04-02 DIAGNOSIS — Z6841 Body Mass Index (BMI) 40.0 and over, adult: Secondary | ICD-10-CM | POA: Insufficient documentation

## 2017-04-02 NOTE — Progress Notes (Signed)
Appt start time: 4:50 end time:  5:15 Assessment: 5th SWL Appointment.   Start Wt at [facility]: 261.0 Wt: 265 BMI: 43.49  Likes to be called Debby. Pt states she thinks she is going to be very successful after surgery.  Pt arrives having gained about 5 pounds. Pt states she only needs one more visit. Pt states she is not as hungry as she was before. Pt state she wants to invest in a stand up desk. Pt states she knows she would lose more weight if she could exercise more intensely but she does not have the energy and her legs would hurt too bad to stand that long.    Preferred Learning Style:   No preference indicated   Learning Readiness:   Ready  Change in progress  MEDICATIONS: See list   DIETARY INTAKE:  24-hr recall:  B ( AM): boiled eggs, toast---oatmeal with banana  Snk ( AM): greek yogurt or fruit or celery L ( 12:15PM): tuna sandwich and celery Snk ( PM): 2 protein shake D ( PM): chicken or pork chops, green beans or corn or pinto beans  Snk ( PM): fruit and milk Beverages: water,2 premier shakes, 2 skim milk, 1 G2 gatorade  Usual physical activity: line dancing and walking   Diet to Follow: Calories: 1600  Carbohydrate: 180 Protein: 120 Fat: 44   Nutritional Diagnosis:  [place]-3.3 Overweight/obesity related to past poor dietary habits and physical inactivity as evidenced by patient w/ upcoming sleeve gastrectomy surgery following dietary guidelines for continued weight loss.    Intervention:  Nutrition counseling for upcoming Bariatric Surgery.  Goals:  - Aim to get in at least 30 min of physical activity 5 days a week. Do arm exercises at home when unable to get outside and walk.  -Keep up being consistent with all your changes -Do not drink with your meals  Teaching Method Utilized:  Visual Auditory Hands on  Handouts given during visit include:  none   Barriers to learning/adherence to lifestyle change: none  Demonstrated degree of understanding via:   Teach Back   Monitoring/Evaluation:  Dietary intake, exercise, and body weight in 1 month(s).

## 2017-04-22 ENCOUNTER — Encounter: Payer: BLUE CROSS/BLUE SHIELD | Attending: Surgery | Admitting: Skilled Nursing Facility1

## 2017-04-22 DIAGNOSIS — G4733 Obstructive sleep apnea (adult) (pediatric): Secondary | ICD-10-CM | POA: Insufficient documentation

## 2017-04-22 DIAGNOSIS — Z6841 Body Mass Index (BMI) 40.0 and over, adult: Secondary | ICD-10-CM | POA: Insufficient documentation

## 2017-04-22 DIAGNOSIS — Z713 Dietary counseling and surveillance: Secondary | ICD-10-CM | POA: Diagnosis not present

## 2017-04-22 DIAGNOSIS — E669 Obesity, unspecified: Secondary | ICD-10-CM

## 2017-04-24 ENCOUNTER — Encounter: Payer: Self-pay | Admitting: Skilled Nursing Facility1

## 2017-04-24 NOTE — Progress Notes (Signed)
Pre-Operative Nutrition Class:  Appt start time: 7005   End time:  1830.  Patient was seen on 04/22/2017 for Pre-Operative Bariatric Surgery Education at [facility].   Surgery date:  Surgery type: Sleeve Gastrectomy Start weight at [facility]: 261 Weight today: 267.3  Samples given per MNT protocol. Patient educated on appropriate usage: Bariatric Advantage Multivitamin Lot # [id number] Exp: 07/19  Bariatric Advantage Calcium  Lot # [id number] Exp: nov-08-2016  Renee Pain Protein Shake Lot # [id number] Exp: Dec 03, 2017  The following the learning objectives were met by the patient during this course:  Identify Pre-Op Dietary Goals and will begin 2 weeks pre-operatively  Identify appropriate sources of fluids and proteins   State protein recommendations and appropriate sources pre and post-operatively  Identify Post-Operative Dietary Goals and will follow for 2 weeks post-operatively  Identify appropriate multivitamin and calcium sources  Describe the need for physical activity post-operatively and will follow MD recommendations  State when to call healthcare provider regarding medication questions or post-operative complications  Handouts given during class include:  Pre-Op Bariatric Surgery Diet Handout  Protein Shake Handout  Post-Op Bariatric Surgery Nutrition Handout  BEL[facility] Program Information Flyer  Support Group Information Flyer  [facility] Bariatric Supplements Price List  [facility]: [facility] MD.

## 2017-05-01 ENCOUNTER — Encounter: Payer: Self-pay | Admitting: Skilled Nursing Facility1

## 2017-05-01 ENCOUNTER — Encounter: Payer: BLUE CROSS/BLUE SHIELD | Admitting: Skilled Nursing Facility1

## 2017-05-01 DIAGNOSIS — E669 Obesity, unspecified: Secondary | ICD-10-CM

## 2017-05-01 NOTE — Progress Notes (Signed)
Appt start time: 4:50 end time:  5:15 Assessment: 5th SWL Appointment.   Start Wt at [facility]: 261.0 Wt: 265 BMI: 43.49  Likes to be called Natasha Lee. Pt states she thinks she is going to be very successful after surgery.   Pt arrives having had pre-op class. Pt states she is absolutely ready for surgery.   Preferred Learning Style:   No preference indicated   Learning Readiness:   Ready  Change in progress  MEDICATIONS: See list   DIETARY INTAKE:  24-hr recall:  B ( AM): boiled eggs, toast---oatmeal with banana  Snk ( AM): greek yogurt or fruit or celery L ( 12:15PM): tuna sandwich and celery Snk ( PM): 2 protein shake D ( PM): chicken or pork chops, green beans or corn or pinto beans  Snk ( PM): fruit and milk Beverages: water,2 premier shakes, 2 skim milk, 1 G2 gatorade  Usual physical activity: line dancing and walking   Diet to Follow: Calories: 1600  Carbohydrate: 180 Protein: 120 Fat: 44   Nutritional Diagnosis:  -3.3 Overweight/obesity related to past poor dietary habits and physical inactivity as evidenced by patient w/ upcoming sleeve gastrectomy surgery following dietary guidelines for continued weight loss.    Intervention:  Nutrition counseling for upcoming Bariatric Surgery.  Goals:  - Aim to get in at least 30 min of physical activity 5 days a week. Do arm exercises at home when unable to get outside and walk.  -Keep up being consistent with all your changes -Do not drink with your meals  Teaching Method Utilized:  Visual Auditory Hands on  Handouts given during visit include:  none   Barriers to learning/adherence to lifestyle change: none  Demonstrated degree of understanding via:  Teach Back   Monitoring/Evaluation:  Dietary intake, exercise, and body weight in 1 month(s).

## 2017-06-12 NOTE — Progress Notes (Signed)
Need orders in epic.  Surgery on 06/17/17.  thanks

## 2017-06-13 ENCOUNTER — Other Ambulatory Visit: Payer: Self-pay

## 2017-06-13 ENCOUNTER — Encounter (HOSPITAL_COMMUNITY)
Admission: RE | Admit: 2017-06-13 | Discharge: 2017-06-13 | Disposition: A | Discharge status: Home / Self Care | Payer: BLUE CROSS/BLUE SHIELD | Source: Ambulatory Visit | Attending: Surgery | Admitting: Surgery

## 2017-06-13 ENCOUNTER — Encounter (HOSPITAL_COMMUNITY): Payer: Self-pay

## 2017-06-13 DIAGNOSIS — Z01812 Encounter for preprocedural laboratory examination: Secondary | ICD-10-CM | POA: Insufficient documentation

## 2017-06-13 DIAGNOSIS — E669 Obesity, unspecified: Secondary | ICD-10-CM | POA: Diagnosis not present

## 2017-06-13 LAB — COMPREHENSIVE METABOLIC PANEL
ALK PHOS: 98 U/L (ref 38–126)
ALT: 28 U/L (ref 14–54)
ANION GAP: 10 (ref 5–15)
AST: 30 U/L (ref 15–41)
Albumin: 4.6 g/dL (ref 3.5–5.0)
BUN: 27 mg/dL — ABNORMAL HIGH (ref 6–20)
CALCIUM: 9.9 mg/dL (ref 8.9–10.3)
CHLORIDE: 103 mmol/L (ref 101–111)
CO2: 25 mmol/L (ref 22–32)
Creatinine, Ser: 0.82 mg/dL (ref 0.44–1.00)
GFR calc non Af Amer: >60 mL/min (ref >60)
Glucose, Bld: 89 mg/dL (ref 65–99)
POTASSIUM: 4.3 mmol/L (ref 3.5–5.1)
SODIUM: 138 mmol/L (ref 135–145)
Total Bilirubin: 0.9 mg/dL (ref 0.3–1.2)
Total Protein: 7.6 g/dL (ref 6.5–8.1)

## 2017-06-13 LAB — CBC WITH DIFFERENTIAL/PLATELET
Basophils Absolute: 0 10*3/uL (ref 0.0–0.1)
Basophils Relative: 0 %
EOS ABS: 0.1 10*3/uL (ref 0.0–0.7)
EOS PCT: 2 %
HCT: 42.3 % (ref 36.0–46.0)
Hemoglobin: 14.6 g/dL (ref 12.0–15.0)
LYMPHS ABS: 1.7 10*3/uL (ref 0.7–4.0)
Lymphocytes Relative: 25 %
MCH: 29.9 pg (ref 26.0–34.0)
MCHC: 34.5 g/dL (ref 30.0–36.0)
MCV: 86.5 fL (ref 78.0–100.0)
MONOS PCT: 5 %
Monocytes Absolute: 0.3 10*3/uL (ref 0.1–1.0)
Neutro Abs: 4.8 10*3/uL (ref 1.7–7.7)
Neutrophils Relative %: 68 %
PLATELETS: 265 10*3/uL (ref 150–400)
RBC: 4.89 MIL/uL (ref 3.87–5.11)
RDW: 13.4 % (ref 11.5–15.5)
WBC: 7 10*3/uL (ref 4.0–10.5)

## 2017-06-13 NOTE — Patient Instructions (Signed)
Richardean SaleDeborah Kyllonen  06/13/2017   Your procedure is scheduled on: 06/17/2017    [facility]  Entrance [facility] [place]  elevators to 3rd floor to  Short Stay Center at   0530 AM.     Call this number if you have problems the morning of surgery [phone]    Remember: ONLY 1 PERSON MAY GO WITH YOU TO SHORT STAY TO GET  READY MORNING OF YOUR SURGERY.  Do not eat food or drink liquids :After Midnight.     Take these medicines the morning of surgery with A SIP OF WATER:  DO NOT TAKE ANY DIABETIC MEDICATIONS DAY OF YOUR SURGERY                               You may not have any metal on your body including hair pins and              piercings  Do not wear jewelry, make-up, lotions, powders or perfumes, deodorant             Do not wear nail polish.  Do not shave  48 hours prior to surgery.                 Do not bring valuables to the hospital. [facility] IS NOT             RESPONSIBLE   FOR VALUABLES.  Contacts, dentures or bridgework may not be worn into surgery.  Leave suitcase in the car. After surgery it may be brought to your room.             Coughing and deep breathing exercises, leg exercises               Please read over the following fact sheets you were given: _____________________________________________________________________             [facility] Pre[facility] Before surgery, you can play an important role.  Because skin is not sterile, your skin needs to be as free of germs as possible.  You can reduce the number of germs on your skin by washing with CHG (chlorahexidine gluconate) soap before surgery.  CHG is an antiseptic cleaner which kills germs and bonds with the skin to continue killing germs even after washing. Please DO NOT use if you have an allergy to CHG or antibacterial soaps.  If your skin becomes reddened/irritated stop using the CHG and inform your nurse when you arrive at Short Stay. Do not shave (including  legs and underarms) for at least 48 hours prior to the first CHG shower.  You may shave your face/neck. Please follow these instructions carefully:  1.  Shower with CHG Soap the night before surgery and the  morning of Surgery.  2.  If you choose to wash your hair, wash your hair first as usual with your  normal  shampoo.  3.  After you shampoo, rinse your hair and body thoroughly to remove the  shampoo.                           4.  Use CHG as you would any other liquid soap.  You can apply chg directly  to the skin and wash  Gently with a scrungie or clean washcloth.  5.  Apply the CHG Soap to your body ONLY FROM THE NECK DOWN.   Do not use on face/ open                           Wound or open sores. Avoid contact with eyes, ears mouth and genitals (private parts).                       Wash face,  Genitals (private parts) with your normal soap.             6.  Wash thoroughly, paying special attention to the area where your surgery  will be performed.  7.  Thoroughly rinse your body with warm water from the neck down.  8.  DO NOT shower/wash with your normal soap after using and rinsing off  the CHG Soap.                9.  Pat yourself dry with a clean towel.            10.  Wear clean pajamas.            11.  Place clean sheets on your bed the night of your first shower and do not  sleep with pets. Day of Surgery : Do not apply any lotions/deodorants the morning of surgery.  Please wear clean clothes to the hospital/surgery center.  FAILURE TO FOLLOW THESE INSTRUCTIONS MAY RESULT IN THE CANCELLATION OF YOUR SURGERY PATIENT SIGNATURE_________________________________  NURSE SIGNATURE__________________________________  ________________________________________________________________________  WHAT IS A BLOOD TRANSFUSION? Blood Transfusion Information  A transfusion is the replacement of blood or some of its parts. Blood is made up of multiple cells which provide  different functions.  Red blood cells carry oxygen and are used for blood loss replacement.  White blood cells fight against infection.  Platelets control bleeding.  Plasma helps clot blood.  Other blood products are available for specialized needs, such as hemophilia or other clotting disorders. BEFORE THE TRANSFUSION  Who gives blood for transfusions?   Healthy volunteers who are fully evaluated to make sure their blood is safe. This is blood bank blood. Transfusion therapy is the safest it has ever been in the practice of medicine. Before blood is taken from a donor, a complete history is taken to make sure that person has no history of diseases nor engages in risky social behavior (examples are intravenous drug use or sexual activity with multiple partners). The donor's travel history is screened to minimize risk of transmitting infections, such as malaria. The donated blood is tested for signs of infectious diseases, such as HIV and hepatitis. The blood is then tested to be sure it is compatible with you in order to minimize the chance of a transfusion reaction. If you or a relative donates blood, this is often done in anticipation of surgery and is not appropriate for emergency situations. It takes many days to process the donated blood. RISKS AND COMPLICATIONS Although transfusion therapy is very safe and saves many lives, the main dangers of transfusion include:   Getting an infectious disease.  Developing a transfusion reaction. This is an allergic reaction to something in the blood you were given. Every precaution is taken to prevent this. The decision to have a blood transfusion has been considered carefully by your caregiver before blood is given. Blood is not given unless the benefits outweigh  the risks. AFTER THE TRANSFUSION  Right after receiving a blood transfusion, you will usually feel much better and more energetic. This is especially true if your red blood cells have  gotten low (anemic). The transfusion raises the level of the red blood cells which carry oxygen, and this usually causes an energy increase.  The nurse administering the transfusion will monitor you carefully for complications. HOME CARE INSTRUCTIONS  No special instructions are needed after a transfusion. You may find your energy is better. Speak with your caregiver about any limitations on activity for underlying diseases you may have. SEEK MEDICAL CARE IF:   Your condition is not improving after your transfusion.  You develop redness or irritation at the intravenous (IV) site. SEEK IMMEDIATE MEDICAL CARE IF:  Any of the following symptoms occur over the next 12 hours:  Shaking chills.  You have a temperature by mouth above 102 F (38.9 C), not controlled by medicine.  Chest, back, or muscle pain.  People around you feel you are not acting correctly or are confused.  Shortness of breath or difficulty breathing.  Dizziness and fainting.  You get a rash or develop hives.  You have a decrease in urine output.  Your urine turns a dark color or changes to pink, red, or brown. Any of the following symptoms occur over the next 10 days:  You have a temperature by mouth above 102 F (38.9 C), not controlled by medicine.  Shortness of breath.  Weakness after normal activity.  The white part of the eye turns yellow (jaundice).  You have a decrease in the amount of urine or are urinating less often.  Your urine turns a dark color or changes to pink, red, or brown. Document Released: 06/22/2000 Document Revised: 09/17/2011 Document Reviewed: 02/09/2008 ExitCare Patient Information 2014 [place].  _______________________________________________________________________  Incentive Spirometer  An incentive spirometer is a tool that can help keep your lungs clear and active. This tool measures how well you are filling your lungs with each breath. Taking long deep breaths  may help reverse or decrease the chance of developing breathing (pulmonary) problems (especially infection) following:  A long period of time when you are unable to move or be active. BEFORE THE PROCEDURE   If the spirometer includes an indicator to show your best effort, your nurse or respiratory therapist will set it to a desired goal.  If possible, sit up straight or lean slightly forward. Try not to slouch.  Hold the incentive spirometer in an upright position. INSTRUCTIONS FOR USE  1. Sit on the edge of your bed if possible, or sit up as far as you can in bed or on a chair. 2. Hold the incentive spirometer in an upright position. 3. Breathe out normally. 4. Place the mouthpiece in your mouth and seal your lips tightly around it. 5. Breathe in slowly and as deeply as possible, raising the piston or the ball toward the top of the column. 6. Hold your breath for 3-5 seconds or for as long as possible. Allow the piston or ball to fall to the bottom of the column. 7. Remove the mouthpiece from your mouth and breathe out normally. 8. Rest for a few seconds and repeat Steps 1 through 7 at least 10 times every 1-2 hours when you are awake. Take your time and take a few normal breaths between deep breaths. 9. The spirometer may include an indicator to show your best effort. Use the indicator as a goal to work  toward during each repetition. 10. After each set of 10 deep breaths, practice coughing to be sure your lungs are clear. If you have an incision (the cut made at the time of surgery), support your incision when coughing by placing a pillow or rolled up towels firmly against it. Once you are able to get out of bed, walk around indoors and cough well. You may stop using the incentive spirometer when instructed by your caregiver.  RISKS AND COMPLICATIONS  Take your time so you do not get dizzy or light-headed.  If you are in pain, you may need to take or ask for pain medication before doing  incentive spirometry. It is harder to take a deep breath if you are having pain. AFTER USE  Rest and breathe slowly and easily.  It can be helpful to keep track of a log of your progress. Your caregiver can provide you with a simple table to help with this. If you are using the spirometer at home, follow these instructions: [place] IF:   You are having difficultly using the spirometer.  You have trouble using the spirometer as often as instructed.  Your pain medication is not giving enough relief while using the spirometer.  You develop fever of 100.5 F (38.1 C) or higher. SEEK IMMEDIATE MEDICAL CARE IF:   You cough up bloody sputum that had not been present before.  You develop fever of 102 F (38.9 C) or greater.  You develop worsening pain at or near the incision site. MAKE SURE YOU:   Understand these instructions.  Will watch your condition.  Will get help right away if you are not doing well or get worse. Document Released: 11/05/2006 Document Revised: 09/17/2011 Document Reviewed: 01/06/2007 [facility] Patient Information 2014 [place], [place].   ________________________________________________________________________

## 2017-06-13 NOTE — H&P (Signed)
Natasha Lee 10/03/2016 1:49 PM Location: [facility] Patient #: [id number] DOB: 1961-04-03 Married / Language: [place] / Race: White Female   History of Present Illness [name](Natasha Vandevoorde B. Daphine DeutscherMartin MD; 10/03/2016 2:30 PM) The patient is a 56 year old female who presents for a bariatric surgery evaluation. I had done a Lapband on a friend of hers and she wanted to pursue bariatric surgery for fear of becoming diabetic and winding up on dialysis like her dad. Her weight problems began during menopause and she has gained 100 lbs. She and her husband who is a heart patient eat a heart healthy diet and she is very active in [employer] but despite this she has been on numerous diets and is unable to lose any weight. Her weight is 265 and her BMI is 43. She has OSA and wears a CPAP at night. She denies GER; she has been endoscoped for [name]'s related gastric ulcers which have resolved.   I explained sleeve gastrectomy to her in detail including complications not limited to bleeding or leaks. She wants to pursue sleeve gastrectomy.   Past Surgical History Doristine Devoid([name], CMA; 10/03/2016 1:49 PM) Breast Biopsy  Bilateral. multiple  Diagnostic Studies History Doristine Devoid([name], CMA; 10/03/2016 1:49 PM) Colonoscopy  1-5 years ago Mammogram  within last year Pap Smear  1-5 years ago  Allergies Doristine Devoid([name], CMA; 10/03/2016 1:52 PM) No Known Drug Allergies 10/03/2016  Medication History Doristine Devoid([name], CMA; 10/03/2016 1:52 PM) NexIUM 24HR (20MG  Capsule DR, Oral) Active. Medications Reconciled  Social History Doristine Devoid([name], CMA; 10/03/2016 1:49 PM) Caffeine use  Coffee. No alcohol use  No drug use  Tobacco use  Never smoker.  Family History Doristine Devoid([name], CMA; 10/03/2016 1:49 PM) Cervical Cancer  Mother. Colon Cancer  Family Members In General. Colon Polyps  Mother. Diabetes Mellitus  Family Members In General, Father. Heart Disease  Father. Hypertension   Father. Thyroid problems  Mother.  Pregnancy / Birth History Doristine Devoid([name], CMA; 10/03/2016 1:49 PM) Age at menarche  14 years. Age of menopause  [age] Contraceptive History  Oral contraceptives. Gravida  2 Irregular periods  Maternal age  515-20 Para  2  Other Problems Doristine Devoid([name], CMA; 10/03/2016 1:49 PM) Back Pain  Gastric Ulcer  Sleep Apnea  Ulcerative Colitis     Review of Systems ([name] CMA; 10/03/2016 1:49 PM) General Present- Fatigue, Night Sweats and Weight Gain. Not Present- Appetite Loss, Chills, Fever and Weight Loss. Skin Not Present- Change in Wart/Mole, Dryness, Hives, Jaundice, New Lesions, Non-Healing Wounds, Rash and Ulcer. HEENT Present- Ringing in the Ears, Seasonal Allergies, Sinus Pain and Wears glasses/contact lenses. Not Present- Earache, Hearing Loss, Hoarseness, Nose Bleed, Oral Ulcers, Sore Throat, Visual Disturbances and Yellow Eyes. Respiratory Present- Difficulty Breathing and Snoring. Not Present- Bloody sputum, Chronic Cough and Wheezing. Breast Not Present- Breast Mass, Breast Pain, Nipple Discharge and Skin Changes. Cardiovascular Present- Difficulty Breathing Lying Down, Leg Cramps, Shortness of Breath and Swelling of Extremities. Not Present- Chest Pain, Palpitations and Rapid Heart Rate. Gastrointestinal Present- Abdominal Pain, Constipation and Excessive gas. Not Present- Bloating, Bloody Stool, Change in Bowel Habits, Chronic diarrhea, Difficulty Swallowing, Gets full quickly at meals, Hemorrhoids, Indigestion, Nausea, Rectal Pain and Vomiting. Female Genitourinary Present- Frequency, Nocturia, Painful Urination and Urgency. Not Present- Pelvic Pain. Musculoskeletal Present- Back Pain, Joint Pain, Joint Stiffness, Muscle Pain, Muscle Weakness and Swelling of Extremities. Neurological Present- Headaches, Numbness, Trouble walking and Weakness. Not Present- Decreased Memory, Fainting, Seizures, Tingling and Tremor. Psychiatric  Present-  Anxiety, Change in Sleep Pattern and Depression. Not Present- Bipolar, Fearful and Frequent crying. Endocrine Present- Heat Intolerance and Hot flashes. Not Present- Cold Intolerance, Excessive Hunger, Hair Changes and New Diabetes.  Vitals ([name] CMA; 10/03/2016 1:51 PM) 10/03/2016 1:50 PM Weight: 265 lb Height: 66in Body Surface Area: 2.25 m Body Mass Index: 42.77 kg/m  Temp.: 98.46F(Oral)  Pulse: 96 (Regular)  BP: 130/82 (Sitting, Left Arm, Standard)       Physical Exam ([name] B. Daphine DeutscherMartin MD; 10/03/2016 2:31 PM) General Note: [place] Coke[place]out WF NAD HEENT glasses Neck supple without masses Chest clear Heart SR without murmurs Abdomen obese with prior BTL scars; nontender Ext FROM Neuro alert and oriented x 3     Assessment & Plan [name](Andi Mahaffy B. Daphine DeutscherMartin MD; 10/03/2016 2:33 PM) MORBID OBESITY, UNSPECIFIED OBESITY TYPE (E66.01) Impression: Morbid obesity with OSA and BMI 43. Will work toward sleeve gastrectomy.  Preop Dec 6th and questions answered. PE hasnt changed.  Plan sleeve gastrectomy and will evaluate for possible hiatal hernia.      [name], MD, FACS

## 2017-06-13 NOTE — Progress Notes (Signed)
cmet results routed to dr Luretha Murphymatthew martin by [employer]

## 2017-06-13 NOTE — Progress Notes (Signed)
EKG and CXR- 01/23/17-epic

## 2017-06-14 LAB — ABO/RH: ABO/RH(D): A NEG

## 2017-06-16 NOTE — Anesthesia Preprocedure Evaluation (Addendum)
Anesthesia Evaluation  Patient identified by MRN, date of birth, ID band Patient awake    Reviewed: Allergy & Precautions, NPO status , Patient's Chart, lab work & pertinent test results  Airway Mallampati: III  TM Distance: >3 FB Neck ROM: Full    Dental no notable dental hx. (+) Teeth Intact, Dental Advisory Given   Pulmonary sleep apnea and Continuous Positive Airway Pressure Ventilation ,    Pulmonary exam normal breath sounds clear to auscultation       Cardiovascular negative cardio ROS Normal cardiovascular exam Rhythm:Regular Rate:Normal     Neuro/Psych negative neurological ROS  negative psych ROS   GI/Hepatic Neg liver ROS, GERD  Medicated,  Endo/Other  Morbid obesity  Renal/GU negative Renal ROS  negative genitourinary   Musculoskeletal negative musculoskeletal ROS (+)   Abdominal (+) + obese,   Peds negative pediatric ROS (+)  Hematology negative hematology ROS (+)   Anesthesia Other Findings   Reproductive/Obstetrics negative OB ROS                          Lab Results  Component Value Date   WBC 7.0 06/13/2017   HGB 14.6 06/13/2017   HCT 42.3 06/13/2017   MCV 86.5 06/13/2017   PLT 265 06/13/2017   Lab Results  Component Value Date   CREATININE 0.82 06/13/2017   BUN 27 (H) 06/13/2017   NA 138 06/13/2017   K 4.3 06/13/2017   CL 103 06/13/2017   CO2 25 06/13/2017   No results found for: INR, PROTIME   Anesthesia Physical Anesthesia Plan  ASA: III  Anesthesia Plan: General   Post-op Pain Management:    Induction: Intravenous  PONV Risk Score and Plan: Ondansetron, Dexamethasone, Midazolam and Scopolamine patch - Pre-op  Airway Management Planned: Oral ETT  Additional Equipment: None  Intra-op Plan:   Post-operative Plan: Extubation in OR  Informed Consent: I have reviewed the patients History and Physical, chart, labs and discussed the procedure  including the risks, benefits and alternatives for the proposed anesthesia with the patient or authorized representative who has indicated his/her understanding and acceptance.   Dental advisory given  Plan Discussed with: CRNA  Anesthesia Plan Comments: (  )      Anesthesia Quick Evaluation

## 2017-06-17 ENCOUNTER — Inpatient Hospital Stay (HOSPITAL_COMMUNITY): Payer: BLUE CROSS/BLUE SHIELD | Admitting: Anesthesiology

## 2017-06-17 ENCOUNTER — Encounter (HOSPITAL_COMMUNITY)
Admission: RE | Disposition: A | Discharge status: Home / Self Care | Payer: Self-pay | Source: Ambulatory Visit | Attending: Surgery

## 2017-06-17 ENCOUNTER — Encounter (HOSPITAL_COMMUNITY): Payer: Self-pay | Admitting: Emergency Medicine

## 2017-06-17 ENCOUNTER — Other Ambulatory Visit: Payer: Self-pay

## 2017-06-17 ENCOUNTER — Inpatient Hospital Stay (HOSPITAL_COMMUNITY)
Admission: RE | Admit: 2017-06-17 | Discharge: 2017-06-18 | DRG: 621 | Disposition: A | Discharge status: Home / Self Care | Payer: BLUE CROSS/BLUE SHIELD | Source: Ambulatory Visit | Attending: Surgery | Admitting: Surgery

## 2017-06-17 DIAGNOSIS — Z9989 Dependence on other enabling machines and devices: Secondary | ICD-10-CM | POA: Diagnosis not present

## 2017-06-17 DIAGNOSIS — Z9884 Bariatric surgery status: Secondary | ICD-10-CM

## 2017-06-17 DIAGNOSIS — K449 Diaphragmatic hernia without obstruction or gangrene: Secondary | ICD-10-CM | POA: Diagnosis present

## 2017-06-17 DIAGNOSIS — K219 Gastro-esophageal reflux disease without esophagitis: Secondary | ICD-10-CM | POA: Diagnosis present

## 2017-06-17 DIAGNOSIS — G4733 Obstructive sleep apnea (adult) (pediatric): Secondary | ICD-10-CM | POA: Diagnosis present

## 2017-06-17 DIAGNOSIS — Z6841 Body Mass Index (BMI) 40.0 and over, adult: Secondary | ICD-10-CM | POA: Diagnosis not present

## 2017-06-17 HISTORY — PX: LAPAROSCOPIC GASTRIC SLEEVE RESECTION: SHX5895

## 2017-06-17 LAB — CREATININE, SERUM
Creatinine, Ser: 0.97 mg/dL (ref 0.44–1.00)
GFR calc non Af Amer: >60 mL/min (ref >60)

## 2017-06-17 LAB — CBC
HEMATOCRIT: 39.4 % (ref 36.0–46.0)
Hemoglobin: 13.7 g/dL (ref 12.0–15.0)
MCH: 30.6 pg (ref 26.0–34.0)
MCHC: 34.8 g/dL (ref 30.0–36.0)
MCV: 88.1 fL (ref 78.0–100.0)
Platelets: 228 10*3/uL (ref 150–400)
RBC: 4.47 MIL/uL (ref 3.87–5.11)
RDW: 13.5 % (ref 11.5–15.5)
WBC: 11.7 10*3/uL — AB (ref 4.0–10.5)

## 2017-06-17 LAB — TYPE AND SCREEN
ABO/RH(D): A NEG
ANTIBODY SCREEN: NEGATIVE

## 2017-06-17 LAB — PREGNANCY, URINE: Preg Test, Ur: NEGATIVE

## 2017-06-17 SURGERY — GASTRECTOMY, SLEEVE, LAPAROSCOPIC
Anesthesia: General | Site: Abdomen

## 2017-06-17 MED ORDER — ACETAMINOPHEN 160 MG/5ML PO SOLN
650.0000 mg | ORAL | Status: DC | PRN
  Administered 2017-06-17 – 2017-06-18 (×2): 650 mg via ORAL
  Filled 2017-06-17 (×2): qty 20.3

## 2017-06-17 MED ORDER — SCOPOLAMINE 1 MG/3DAYS TD PT72
1.0000 | MEDICATED_PATCH | TRANSDERMAL | Status: DC
  Administered 2017-06-17: 1.5 mg via TRANSDERMAL
  Filled 2017-06-17: qty 1

## 2017-06-17 MED ORDER — LACTATED RINGERS IR SOLN
Status: DC | PRN
  Administered 2017-06-17: 1000 mL

## 2017-06-17 MED ORDER — ACETAMINOPHEN 500 MG PO TABS
1000.0000 mg | ORAL_TABLET | ORAL | Status: AC
  Administered 2017-06-17: 1000 mg via ORAL
  Filled 2017-06-17: qty 2

## 2017-06-17 MED ORDER — PANTOPRAZOLE SODIUM 40 MG IV SOLR
40.0000 mg | Freq: Every day | INTRAVENOUS | Status: DC
  Administered 2017-06-17: 21:00:00 40 mg via INTRAVENOUS
  Filled 2017-06-17: qty 40

## 2017-06-17 MED ORDER — DEXAMETHASONE SODIUM PHOSPHATE 4 MG/ML IJ SOLN: 4.0000 mg | INTRAMUSCULAR | Status: DC

## 2017-06-17 MED ORDER — MIDAZOLAM HCL 5 MG/5ML IJ SOLN
INTRAMUSCULAR | Status: DC | PRN
  Administered 2017-06-17: 2 mg via INTRAVENOUS

## 2017-06-17 MED ORDER — CEFOTETAN DISODIUM-DEXTROSE 2-2.08 GM-%(50ML) IV SOLR
2.0000 g | INTRAVENOUS | Status: AC
  Administered 2017-06-17: 2 g via INTRAVENOUS

## 2017-06-17 MED ORDER — MIDAZOLAM HCL 2 MG/2ML IJ SOLN
INTRAMUSCULAR | Status: AC
Start: 2017-06-17 — End: 2017-06-17
  Filled 2017-06-17: qty 2

## 2017-06-17 MED ORDER — FENTANYL CITRATE (PF) 250 MCG/5ML IJ SOLN
INTRAMUSCULAR | Status: DC | PRN
  Administered 2017-06-17 (×2): 50 ug via INTRAVENOUS

## 2017-06-17 MED ORDER — EPHEDRINE SULFATE-NACL 50-0.9 MG/10ML-% IV SOSY
PREFILLED_SYRINGE | INTRAVENOUS | Status: DC | PRN
  Administered 2017-06-17: 10 mg via INTRAVENOUS
  Administered 2017-06-17: 5 mg via INTRAVENOUS

## 2017-06-17 MED ORDER — SUGAMMADEX SODIUM 200 MG/2ML IV SOLN
INTRAVENOUS | Status: AC
  Filled 2017-06-17: qty 2

## 2017-06-17 MED ORDER — CHLORHEXIDINE GLUCONATE CLOTH 2 % EX PADS: 6.0000 | MEDICATED_PAD | Freq: Once | CUTANEOUS | Status: DC

## 2017-06-17 MED ORDER — ROCURONIUM BROMIDE 50 MG/5ML IV SOSY
PREFILLED_SYRINGE | INTRAVENOUS | Status: AC
  Filled 2017-06-17: qty 5

## 2017-06-17 MED ORDER — SUCCINYLCHOLINE CHLORIDE 200 MG/10ML IV SOSY
PREFILLED_SYRINGE | INTRAVENOUS | Status: DC | PRN
  Administered 2017-06-17: 160 mg via INTRAVENOUS

## 2017-06-17 MED ORDER — LIDOCAINE 2% (20 MG/ML) 5 ML SYRINGE
INTRAMUSCULAR | Status: AC
  Filled 2017-06-17: qty 5

## 2017-06-17 MED ORDER — LACTATED RINGERS IV SOLN
INTRAVENOUS | Status: DC | PRN
  Administered 2017-06-17: 07:00:00 via INTRAVENOUS

## 2017-06-17 MED ORDER — SODIUM CHLORIDE 0.9 % IJ SOLN
INTRAMUSCULAR | Status: DC | PRN
  Administered 2017-06-17: 10 mL

## 2017-06-17 MED ORDER — ONDANSETRON HCL 4 MG/2ML IJ SOLN: 4.0000 mg | Freq: Once | INTRAMUSCULAR | Status: DC | PRN

## 2017-06-17 MED ORDER — HEPARIN SODIUM (PORCINE) 5000 UNIT/ML IJ SOLN
5000.0000 [IU] | Freq: Three times a day (TID) | INTRAMUSCULAR | Status: DC
  Administered 2017-06-17 – 2017-06-18 (×3): 5000 [IU] via SUBCUTANEOUS
  Filled 2017-06-17 (×3): qty 1

## 2017-06-17 MED ORDER — KETAMINE HCL 10 MG/ML IJ SOLN
INTRAMUSCULAR | Status: AC
  Filled 2017-06-17: qty 1

## 2017-06-17 MED ORDER — KCL IN DEXTROSE-NACL 20-5-0.45 MEQ/L-%-% IV SOLN
INTRAVENOUS | Status: DC
  Administered 2017-06-17: 13:00:00 via INTRAVENOUS
  Administered 2017-06-17: 1000 mL via INTRAVENOUS
  Filled 2017-06-17 (×3): qty 1000

## 2017-06-17 MED ORDER — PROPOFOL 10 MG/ML IV BOLUS
INTRAVENOUS | Status: DC | PRN
  Administered 2017-06-17: 160 mg via INTRAVENOUS

## 2017-06-17 MED ORDER — BUPIVACAINE LIPOSOME 1.3 % IJ SUSP
20.0000 mL | Freq: Once | INTRAMUSCULAR | Status: AC
  Administered 2017-06-17: 20 mL
  Filled 2017-06-17: qty 20

## 2017-06-17 MED ORDER — HEPARIN SODIUM (PORCINE) 5000 UNIT/ML IJ SOLN
5000.0000 [IU] | INTRAMUSCULAR | Status: AC
  Administered 2017-06-17: 5000 [IU] via SUBCUTANEOUS
  Filled 2017-06-17: qty 1

## 2017-06-17 MED ORDER — LIDOCAINE 2% (20 MG/ML) 5 ML SYRINGE
INTRAMUSCULAR | Status: DC | PRN
  Administered 2017-06-17: 80 mg via INTRAVENOUS

## 2017-06-17 MED ORDER — KETAMINE HCL 10 MG/ML IJ SOLN
INTRAMUSCULAR | Status: DC | PRN
  Administered 2017-06-17: 10 mg via INTRAVENOUS
  Administered 2017-06-17: 35 mg via INTRAVENOUS

## 2017-06-17 MED ORDER — APREPITANT 40 MG PO CAPS
40.0000 mg | ORAL_CAPSULE | ORAL | Status: AC
  Administered 2017-06-17: 40 mg via ORAL
  Filled 2017-06-17: qty 1

## 2017-06-17 MED ORDER — DEXAMETHASONE SODIUM PHOSPHATE 10 MG/ML IJ SOLN
INTRAMUSCULAR | Status: DC | PRN
  Administered 2017-06-17: 10 mg via INTRAVENOUS

## 2017-06-17 MED ORDER — LIDOCAINE 2% (20 MG/ML) 5 ML SYRINGE
INTRAMUSCULAR | Status: AC
  Filled 2017-06-17: qty 10

## 2017-06-17 MED ORDER — GABAPENTIN 300 MG PO CAPS
300.0000 mg | ORAL_CAPSULE | ORAL | Status: AC
  Administered 2017-06-17: 300 mg via ORAL
  Filled 2017-06-17: qty 1

## 2017-06-17 MED ORDER — SUGAMMADEX SODIUM 200 MG/2ML IV SOLN
INTRAVENOUS | Status: DC | PRN
  Administered 2017-06-17: 200 mg via INTRAVENOUS

## 2017-06-17 MED ORDER — PROPOFOL 10 MG/ML IV BOLUS
INTRAVENOUS | Status: AC
  Filled 2017-06-17: qty 20

## 2017-06-17 MED ORDER — FENTANYL CITRATE (PF) 250 MCG/5ML IJ SOLN
INTRAMUSCULAR | Status: AC
  Filled 2017-06-17: qty 5

## 2017-06-17 MED ORDER — CELECOXIB 200 MG PO CAPS
400.0000 mg | ORAL_CAPSULE | ORAL | Status: AC
  Administered 2017-06-17: 400 mg via ORAL
  Filled 2017-06-17: qty 2

## 2017-06-17 MED ORDER — SODIUM CHLORIDE 0.9 % IJ SOLN
INTRAMUSCULAR | Status: AC
  Filled 2017-06-17: qty 10

## 2017-06-17 MED ORDER — CEFOTETAN DISODIUM-DEXTROSE 2-2.08 GM-%(50ML) IV SOLR
INTRAVENOUS | Status: AC
  Filled 2017-06-17: qty 50

## 2017-06-17 MED ORDER — SODIUM CHLORIDE 0.9 % IR SOLN
Status: DC | PRN
  Administered 2017-06-17: 1000 mL

## 2017-06-17 MED ORDER — SUCCINYLCHOLINE CHLORIDE 200 MG/10ML IV SOSY
PREFILLED_SYRINGE | INTRAVENOUS | Status: AC
  Filled 2017-06-17: qty 10

## 2017-06-17 MED ORDER — MORPHINE SULFATE (PF) 4 MG/ML IV SOLN: 1.0000 mg | INTRAVENOUS | Status: DC | PRN

## 2017-06-17 MED ORDER — PREMIER PROTEIN SHAKE
2.0000 [oz_av] | ORAL | Status: DC
  Filled 2017-06-17 (×5): qty 325.31

## 2017-06-17 MED ORDER — ONDANSETRON HCL 4 MG/2ML IJ SOLN
INTRAMUSCULAR | Status: AC
  Filled 2017-06-17: qty 2

## 2017-06-17 MED ORDER — ONDANSETRON HCL 4 MG/2ML IJ SOLN
INTRAMUSCULAR | Status: DC | PRN
  Administered 2017-06-17: 4 mg via INTRAVENOUS

## 2017-06-17 MED ORDER — ROCURONIUM BROMIDE 10 MG/ML (PF) SYRINGE
PREFILLED_SYRINGE | INTRAVENOUS | Status: DC | PRN
  Administered 2017-06-17: 50 mg via INTRAVENOUS
  Administered 2017-06-17: 20 mg via INTRAVENOUS

## 2017-06-17 MED ORDER — HYDRALAZINE HCL 20 MG/ML IJ SOLN: 10.0000 mg | INTRAMUSCULAR | Status: DC | PRN

## 2017-06-17 MED ORDER — OXYCODONE HCL 5 MG/5ML PO SOLN: 5.0000 mg | ORAL | Status: DC | PRN

## 2017-06-17 MED ORDER — ONDANSETRON HCL 4 MG/2ML IJ SOLN
4.0000 mg | INTRAMUSCULAR | Status: DC | PRN
Start: 2017-06-17 — End: 2017-06-18

## 2017-06-17 MED ORDER — FENTANYL CITRATE (PF) 100 MCG/2ML IJ SOLN: 25.0000 ug | INTRAMUSCULAR | Status: DC | PRN

## 2017-06-17 MED ORDER — LIDOCAINE 2% (20 MG/ML) 5 ML SYRINGE
INTRAMUSCULAR | Status: DC | PRN
  Administered 2017-06-17: 1.5 mg/kg/h via INTRAVENOUS

## 2017-06-17 MED ORDER — MEPERIDINE HCL 50 MG/ML IJ SOLN: 6.2500 mg | INTRAMUSCULAR | Status: DC | PRN

## 2017-06-17 SURGICAL SUPPLY — 57 items
APPLICATOR COTTON TIP 6IN STRL (MISCELLANEOUS) IMPLANT
APPLIER CLIP 5 13 M/L LIGAMAX5 (MISCELLANEOUS)
APPLIER CLIP ROT 10 11.4 M/L (STAPLE)
APPLIER CLIP ROT 13.4 12 LRG (CLIP)
BLADE SURG 15 STRL LF DISP TIS (BLADE) ×1 IMPLANT
BLADE SURG 15 STRL SS (BLADE) ×1
CABLE HIGH FREQUENCY MONO STRZ (ELECTRODE) ×2 IMPLANT
CLIP APPLIE 5 13 M/L LIGAMAX5 (MISCELLANEOUS) IMPLANT
CLIP APPLIE ROT 10 11.4 M/L (STAPLE) IMPLANT
CLIP APPLIE ROT 13.4 12 LRG (CLIP) IMPLANT
DERMABOND ADVANCED (GAUZE/BANDAGES/DRESSINGS) ×1
DERMABOND ADVANCED .7 DNX12 (GAUZE/BANDAGES/DRESSINGS) ×1 IMPLANT
DEVICE PMI PUNCTURE CLOSURE (MISCELLANEOUS) ×2 IMPLANT
DEVICE SUT QUICK LOAD TK 5 (STAPLE) ×4 IMPLANT
DEVICE SUT TI-KNOT TK 5X26 (MISCELLANEOUS) ×2 IMPLANT
DEVICE SUTURE ENDOST 10MM (ENDOMECHANICALS) ×2 IMPLANT
DISSECTOR BLUNT TIP ENDO 5MM (MISCELLANEOUS) IMPLANT
ELECT REM PT RETURN 15FT ADLT (MISCELLANEOUS) ×2 IMPLANT
GAUZE SPONGE 4X4 12PLY STRL (GAUZE/BANDAGES/DRESSINGS) IMPLANT
GLOVE BIOGEL M 8.0 STRL (GLOVE) ×2 IMPLANT
GOWN STRL REUS W/TWL XL LVL3 (GOWN DISPOSABLE) ×8 IMPLANT
GRASPER SUT TROCAR 14GX15 (MISCELLANEOUS) ×2 IMPLANT
HANDLE STAPLE EGIA 4 XL (STAPLE) ×2 IMPLANT
HOVERMATT SINGLE USE (MISCELLANEOUS) ×2 IMPLANT
KIT BASIN OR (CUSTOM PROCEDURE TRAY) ×2 IMPLANT
MARKER SKIN DUAL TIP RULER LAB (MISCELLANEOUS) ×2 IMPLANT
NEEDLE SPNL 22GX3.5 QUINCKE BK (NEEDLE) ×2 IMPLANT
PACK UNIVERSAL I (CUSTOM PROCEDURE TRAY) ×2 IMPLANT
RELOAD TRI 45 ART MED THCK BLK (STAPLE) ×2 IMPLANT
RELOAD TRI 45 ART MED THCK PUR (STAPLE) ×2 IMPLANT
RELOAD TRI 60 ART MED THCK BLK (STAPLE) ×2 IMPLANT
RELOAD TRI 60 ART MED THCK PUR (STAPLE) ×4 IMPLANT
SCISSORS LAP 5X45 EPIX DISP (ENDOMECHANICALS) IMPLANT
SET IRRIG TUBING LAPAROSCOPIC (IRRIGATION / IRRIGATOR) ×2 IMPLANT
SHEARS HARMONIC ACE PLUS 45CM (MISCELLANEOUS) ×2 IMPLANT
SLEEVE ADV FIXATION 5X100MM (TROCAR) ×4 IMPLANT
SLEEVE GASTRECTOMY 36FR VISIGI (MISCELLANEOUS) ×2 IMPLANT
SOLUTION ANTI FOG 6CC (MISCELLANEOUS) ×2 IMPLANT
SPONGE LAP 18X18 X RAY DECT (DISPOSABLE) ×2 IMPLANT
STAPLER VISISTAT 35W (STAPLE) ×2 IMPLANT
SUT MNCRL AB 4-0 PS2 18 (SUTURE) ×2 IMPLANT
SUT SURGIDAC NAB ES-9 0 48 120 (SUTURE) ×4 IMPLANT
SUT VIC AB 4-0 SH 18 (SUTURE) ×2 IMPLANT
SUT VICRYL 0 TIES 12 18 (SUTURE) ×2 IMPLANT
SYR 10ML ECCENTRIC (SYRINGE) ×2 IMPLANT
SYR 20CC LL (SYRINGE) ×2 IMPLANT
SYR 50ML LL SCALE MARK (SYRINGE) ×2 IMPLANT
TOWEL OR 17X26 10 PK STRL BLUE (TOWEL DISPOSABLE) ×4 IMPLANT
TOWEL OR NON WOVEN STRL DISP B (DISPOSABLE) ×2 IMPLANT
TRAY FOLEY W/METER SILVER 16FR (SET/KITS/TRAYS/PACK) IMPLANT
TROCAR ADV FIXATION 5X100MM (TROCAR) ×2 IMPLANT
TROCAR BLADELESS 15MM (ENDOMECHANICALS) ×2 IMPLANT
TROCAR BLADELESS OPT 5 100 (ENDOMECHANICALS) ×2 IMPLANT
TUBE CALIBRATION LAPBAND (TUBING) IMPLANT
TUBING CONNECTING 10 (TUBING) ×2 IMPLANT
TUBING ENDO SMARTCAP (MISCELLANEOUS) ×2 IMPLANT
TUBING INSUF HEATED (TUBING) ×2 IMPLANT

## 2017-06-17 NOTE — Anesthesia Procedure Notes (Signed)
Procedure Name: Intubation Date/Time: 06/17/2017 7:52 AM Performed by: [place] Frenchie Pribyl C, CRNA Pre-anesthesia Checklist: Patient identified, Emergency Drugs available, Suction available and Patient being monitored Patient Re-evaluated:Patient Re-evaluated prior to induction Oxygen Delivery Method: Circle system utilized Preoxygenation: Pre-oxygenation with 100% oxygen Induction Type: IV induction Ventilation: Mask ventilation without difficulty Laryngoscope Size: Mac and 3 Grade View: Grade II Tube type: Oral Tube size: 7.0 mm Number of attempts: 1 Airway Equipment and Method: Stylet and Oral airway Placement Confirmation: ETT inserted through vocal cords under direct vision,  positive ETCO2 and breath sounds checked- equal and bilateral Secured at: 21 cm Tube secured with: Tape Dental Injury: Teeth and Oropharynx as per pre-operative assessment

## 2017-06-17 NOTE — Discharge Instructions (Signed)
° ° ° °GASTRIC BYPASS/SLEEVE ° Home Care Instructions ° ° These instructions are to help you care for yourself when you go home. ° °Call: If you have any problems. °• Call [phone] and ask for the surgeon on call °• If you need immediate help, come to the ER at [facility].  °• Tell the ER staff that you are a new post-op gastric bypass or gastric sleeve patient °  °Signs and symptoms to report: • Severe vomiting or nausea °o If you cannot keep down clear liquids for longer than 1 day, call your surgeon  °• Abdominal pain that does not get better after taking your pain medication °• Fever over 100.4° F with chills °• Heart beating over 100 beats a minute °• Shortness of breath at rest °• Chest pain °•  Redness, swelling, drainage, or foul odor at incision (surgical) sites °•  If your incisions open or pull apart °• Swelling or pain in calf (lower leg) °• Diarrhea (Loose bowel movements that happen often), frequent watery, uncontrolled bowel movements °• Constipation, (no bowel movements for 3 days) if this happens: Pick one °o Milk of Magnesia, 2 tablespoons by mouth, 3 times a day for 2 days if needed °o Stop taking Milk of Magnesia once you have a bowel movement °o Call your doctor if constipation continues °Or °o Miralax  (instead of Milk of Magnesia) following the label instructions °o Stop taking Miralax once you have a bowel movement °o Call your doctor if constipation continues °• Anything you think is not normal °  °Normal side effects after surgery: • Unable to sleep at night or unable to focus °• Irritability or moody °• Being tearful (crying) or depressed °These are common complaints, possibly related to your anesthesia medications that put you to sleep, stress of surgery, and change in lifestyle.  This usually goes away a few weeks after surgery.  If these feelings continue, call your primary care doctor. °  °Wound Care: You may have surgical glue, steri-strips, or staples over your incisions after  surgery °• Surgical glue:  Looks like a clear film over your incisions and will wear off a little at a time °• Steri-strips: Strips of tape over your incisions. You may notice a yellowish color on the skin under the steri-strips. This is used to make the   steri-strips stick better. Do not pull the steri-strips off - let them fall off °• Staples: Staples may be removed before you leave the hospital °o If you go home with staples, call [facility], [phone] at for an appointment with your surgeon’s nurse to have staples removed 10 days after surgery. °• Showering: You may shower two (2) days after your surgery unless your surgeon tells you differently °o Wash gently around incisions with warm soapy water, rinse well, and gently pat dry  °o No tub baths until staples are removed, steri-strips fall off or glue is gone.  °  °Medications: • Medications should be liquid or crushed if larger than the size of a dime °• Extended release pills (medication that release a little bit at a time through the day) should NOT be crushed or cut. (examples include XL, ER, DR, SR) °• Depending on the size and number of medications you take, you may need to space (take a few throughout the day)/change the time you take your medications so that you do not over-fill your pouch (smaller stomach) °• Make sure you follow-up with your primary care doctor to   make medication changes needed during rapid weight loss and life-style changes °• If you have diabetes, follow up with the doctor that orders your diabetes medication(s) within one week after surgery and check your blood sugar regularly. °• Do not drive while taking prescription pain medication  °• It is ok to take Tylenol by the bottle instructions with your pain medicine or instead of your pain medicine as needed.  DO NOT TAKE NSAIDS (EXAMPLES OF NSAIDS:  IBUPROFREN/ NAPROXEN)  °Diet:                    First 2 Weeks ° You will see the dietician t about two (2) weeks  after your surgery. The dietician will increase the types of foods you can eat if you are handling liquids well: °• If you have severe vomiting or nausea and cannot keep down clear liquids lasting longer than 1 day, call your surgeon @ ([phone]) °Protein Shake °• Drink at least 2 ounces of shake 5-6 times per day °• Each serving of protein shakes (usually 8 - 12 ounces) should have: °o 15 grams of protein  °o And no more than 5 grams of carbohydrate  °• Goal for protein each day: °o Men = 80 grams per day °o Women = 60 grams per day °• Protein powder may be added to fluids such as non-fat milk or Lactaid milk or unsweetened Soy/Almond milk (limit to 35 grams added protein powder per serving) ° °Hydration °• Slowly increase the amount of water and other clear liquids as tolerated (See Acceptable Fluids) °• Slowly increase the amount of protein shake as tolerated  °•  Sip fluids slowly and throughout the day.  Do not use straws. °• May use sugar substitutes in small amounts (no more than 6 - 8 packets per day; i.e. Splenda) ° °Fluid Goal °• The first goal is to drink at least 8 ounces of protein shake/drink per day (or as directed by the nutritionist); some examples of protein shakes are Syntrax Nectar, Adkins Advantage, EAS Edge HP, and Unjury. See handout from pre-op Bariatric Education Class: °o Slowly increase the amount of protein shake you drink as tolerated °o You may find it easier to slowly sip shakes throughout the day °o It is important to get your proteins in first °• Your fluid goal is to drink 64 - 100 ounces of fluid daily °o It may take a few weeks to build up to this °• 32 oz (or more) should be clear liquids  °And  °• 32 oz (or more) should be full liquids (see below for examples) °• Liquids should not contain sugar, caffeine, or carbonation ° °Clear Liquids: °• Water or Sugar-free flavored water (i.e. Fruit H2O, Propel) °• Decaffeinated coffee or tea (sugar-free) °• Crystal Lite, Wyler’s Lite,  Minute Maid Lite °• Sugar-free Jell-O °• Bouillon or broth °• Sugar-free Popsicle:   *Less than 20 calories each; Limit 1 per day ° °Full Liquids: °Protein Shakes/Drinks + 2 choices per day of other full liquids °• Full liquids must be: °o No More Than 15 grams of Carbs per serving  °o No More Than 3 grams of Fat per serving °• Strained low-fat cream soup (except Cream of Potato or Tomato) °• Non-Fat milk °• Fat-free Lactaid Milk °• Unsweetened Soy Or Unsweetened Almond Milk °• Low Sugar yogurt (Dannon Lite & Fit, Greek yogurt; Oikos Triple Zero; Chobani Simply 100; Yoplait 100 calorie Greek - No Fruit on the Bottom) ° °  °Vitamins   and Minerals • Start 1 day after surgery unless otherwise directed by your surgeon °• 2 Chewable Bariatric Specific Multivitamin / Multimineral Supplement with iron (Example: Bariatric Advantage Multi EA) °• Chewable Calcium with Vitamin D-3 °(Example: 3 Chewable Calcium Plus 600 with Vitamin D-3) °o Take 500 mg three (3) times a day for a total of 1500 mg each day °o Do not take all 3 doses of calcium at one time as it may cause constipation, and you can only absorb 500 mg  at a time  °o Do not mix multivitamins containing iron with calcium supplements; take 2 hours apart °• Menstruating women and those with a history of anemia (a blood disease that causes weakness) may need extra iron °o Talk with your doctor to see if you need more iron °• Do not stop taking or change any vitamins or minerals until you talk to your dietitian or surgeon °• Your Dietitian and/or surgeon must approve all vitamin and mineral supplements °  °Activity and Exercise: Limit your physical activity as instructed by your doctor.  It is important to continue walking at home.  During this time, use these guidelines: °• Do not lift anything greater than ten (10) pounds for at least two (2) weeks °• Do not go back to work or drive until your surgeon says you can °• You may have sex when you feel comfortable  °o It is  VERY important for female patients to use a reliable birth control method; fertility often increases after surgery  °o All hormonal birth control will be ineffective for 30 days after surgery due to medications given during surgery a barrier method must be used. °o Do not get pregnant for at least 18 months °• Start exercising as soon as your doctor tells you that you can °o Make sure your doctor approves any physical activity °• Start with a simple walking program °• Walk 5-15 minutes each day, 7 days per week.  °• Slowly increase until you are walking 30-45 minutes per day °Consider joining our BELT program. [phone] or email belt@[employer] °  °Special Instructions Things to remember: °• Use your CPAP when sleeping if this applies to you ° °• [facility] has two free Bariatric Surgery Support Groups that meet monthly °o The 3rd Thursday of each month, 6 pm, [employer] [facility]  °o The 2nd Friday of each month, 11:45 am in the private dining room in the basement of [facility] °• It is very important to keep all follow up appointments with your surgeon, dietitian, primary care physician, and behavioral health practitioner °• Routine follow up schedule with your surgeon include appointments at 2-3 weeks, 6-8 weeks, 6 months, and 1 year at a minimum.  Your surgeon may request to see you more often.   °o After the first year, please follow up with your bariatric surgeon and dietitian at least once a year in order to maintain best weight loss results °[facility]: [phone] °[facility]: [phone] °Bariatric Nurse Coordinator: [phone] °  °   Reviewed and Endorsed  °by [place] Patient Education Committee, June, 2016 °Edits Approved: Aug, 2018 ° ° ° °

## 2017-06-17 NOTE — Anesthesia Postprocedure Evaluation (Signed)
Anesthesia Post Note  Patient: Natasha SaleDeborah Lee  Procedure(s) Performed: LAPAROSCOPIC GASTRIC SLEEVE RESECTION, UPPER ENDOSCOPY (N/A Abdomen)     Patient location during evaluation: PACU Anesthesia Type: General Level of consciousness: awake and alert Pain management: pain level controlled Vital Signs Assessment: post-procedure vital signs reviewed and stable Respiratory status: spontaneous breathing, nonlabored ventilation and respiratory function stable Cardiovascular status: blood pressure returned to baseline and stable Postop Assessment: no apparent nausea or vomiting Anesthetic complications: no    Last Vitals:  Vitals:   06/17/17 1100 06/17/17 1115  BP: 112/79 111/70  Pulse: 79 77  Resp: 16 19  Temp:    SpO2: 93% 91%    Last Pain:  Vitals:   06/17/17 1100  TempSrc:   PainSc: 0-No pain                 Lowella CurbWarren Ray Zahriyah Joo

## 2017-06-17 NOTE — Op Note (Signed)
Surgeon: Wenda LowMatt Nefertari Rebman, MD, FACS  Asst:  Jaclynn GuarneriBen Hoxworth, MD,FACS  Anes:  General endotracheal  Procedure: Laparoscopic posterior repair of hiatal hernia (1 suture) sleeve gastrectomy and upper endoscopy  Diagnosis: Morbid obesity  Complications: None noted  EBL:   15 cc  Description of Procedure:  The patient was take to OR 4 and given general anesthesia.  The abdomen was prepped with Technicare and draped sterilely.  A timeout was performed.  Access to the abdomen was achieved with a 5 mm Optiview without problems.  Following insufflation, the state of the abdomen was found to be free of adhesions.  The ViSiGi 36Fr tube was inserted to deflate the stomach and was pulled back into the esophagus.    The pylorus was identified and we measured 5 cm back and marked the antrum.  At that point we began dissection to take down the greater curvature of the stomach using the Harmonic scalpel.  This dissection was taken all the way up to the left crus.  Posterior attachments of the stomach were also taken down.  In the course of the dissection we discovered a sliding hiatal hernia.  This was suggested on the upper gi and she had a postive balloon test with 10 cc of air.  After a posterior dissection, a single suture of 0 surgidek with the endostitch was placed and secured with a Ty Knot.    The ViSiGi tube was then passed into the antrum and suction applied so that it was snug along the lessor curvature.  The "crow's foot" or incisura was identified.  The sleeve gastrectomy was begun using the Lexmark InternationalCovidien platform stapler beginning with a 4.5 black with tRS followed by a 6 cm black with TRS and then purple loads with tRS.  Marland Kitchen.  When the sleeve was complete the tube was taken off suction and insufflated briefly.  The tube was withdrawn.  Upper endoscopy was then performed by Dr. Johna SheriffHoxworth and no bubbles were noted.     The specimen was extracted through the 15 trocar site.  Wounds were infiltrated with Exparel  and closed with 4-0 Monocryl.  The skin wounds were then sealed with Dermabond. Susy Frizzle.    [name], MD, [facility], [place] [phone]

## 2017-06-17 NOTE — Progress Notes (Signed)
Discussed post op day goals with patient including ambulation, IS, diet progression, pain, and nausea control.  Questions answered. 

## 2017-06-17 NOTE — Interval H&P Note (Signed)
History and Physical Interval Note:  06/17/2017 7:35 AM  Natasha Lee  has presented today for surgery, with the diagnosis of Morbid Obesity  The various methods of treatment have been discussed with the patient and family. After consideration of risks, benefits and other options for treatment, the patient has consented to  Procedure(s): LAPAROSCOPIC GASTRIC SLEEVE RESECTION, UPPER ENDO (N/A) as a surgical intervention .  The patient's history has been reviewed, patient examined, no change in status, stable for surgery.  I have reviewed the patient's chart and labs.  Questions were answered to the patient's satisfaction.     Valarie MerinoMatthew B Rudolpho Claxton

## 2017-06-17 NOTE — Transfer of Care (Signed)
Immediate Anesthesia Transfer of Care Note  Patient: Natasha Lee  Procedure(s) Performed: LAPAROSCOPIC GASTRIC SLEEVE RESECTION, UPPER ENDOSCOPY (N/A Abdomen)  Patient Location: PACU  Anesthesia Type:General  Level of Consciousness: awake, alert  and oriented  Airway & Oxygen Therapy: Patient Spontanous Breathing and Patient connected to face mask oxygen  Post-op Assessment: Report given to RN and Post -op Vital signs reviewed and stable  Post vital signs: Reviewed and stable  Last Vitals:  Vitals:   06/17/17 0536  BP: 100/86  Pulse: 80  Resp: 18  Temp: 36.7 C  SpO2: 99%    Last Pain:  Vitals:   06/17/17 0536  TempSrc: Oral      Patients Stated Pain Goal: 4 (06/17/17 0601)  Complications: No apparent anesthesia complications

## 2017-06-18 LAB — CBC WITH DIFFERENTIAL/PLATELET
BASOS ABS: 0 10*3/uL (ref 0.0–0.1)
BASOS PCT: 0 %
EOS ABS: 0 10*3/uL (ref 0.0–0.7)
EOS PCT: 0 %
HCT: 37.9 % (ref 36.0–46.0)
Hemoglobin: 12.9 g/dL (ref 12.0–15.0)
Lymphocytes Relative: 7 %
Lymphs Abs: 0.8 10*3/uL (ref 0.7–4.0)
MCH: 30.1 pg (ref 26.0–34.0)
MCHC: 34 g/dL (ref 30.0–36.0)
MCV: 88.3 fL (ref 78.0–100.0)
MONO ABS: 0.7 10*3/uL (ref 0.1–1.0)
Monocytes Relative: 7 %
Neutro Abs: 8.6 10*3/uL — ABNORMAL HIGH (ref 1.7–7.7)
Neutrophils Relative %: 86 %
PLATELETS: 222 10*3/uL (ref 150–400)
RBC: 4.29 MIL/uL (ref 3.87–5.11)
RDW: 13.9 % (ref 11.5–15.5)
WBC: 10.1 10*3/uL (ref 4.0–10.5)

## 2017-06-18 NOTE — Progress Notes (Signed)
Pt alert, oriented, tolerating diet, and ambulating.  D/C instructions were given and all questions were answered.  Pt was d/cd home.

## 2017-06-18 NOTE — Progress Notes (Signed)
Noted order for CPAP for home use. Patient has CPAP in place already. No further needs.

## 2017-06-18 NOTE — Discharge Summary (Signed)
Physician Discharge Summary  Patient ID: Natasha SaleDeborah Lee MRN: [id number] DOB/AGE: [birth year] 56 y.o.  Admit date: 06/17/2017 Discharge date: 06/18/2017  Admission Diagnoses:  Morbid obesity  Discharge Diagnoses:  same  Principal Problem:   S/P laparoscopic sleeve gastrectomy Dec 2018   Surgery:  Lap repair of hiatal hernia posteriorly with sleeve gastrectomy  Discharged Condition: improved  Hospital Course:   Had surgery on Monday despite the heavy snow.  Started on liquids and advanced to shakes and ready for discharge on Tuesday.    Consults: none  Significant Diagnostic Studies: none    Discharge Exam: Blood pressure 126/81, pulse 76, temperature 98.2 F (36.8 C), temperature source Oral, resp. rate 17, height 5\' 6"  (1.676 m), weight 117.8 kg (259 lb 12.8 oz), SpO2 99 %. Incisions OK  Disposition: Final discharge disposition not confirmed  Discharge Instructions    Ambulate hourly while awake   Complete by:  As directed    Call MD for:  difficulty breathing, headache or visual disturbances   Complete by:  As directed    Call MD for:  persistant dizziness or light-headedness   Complete by:  As directed    Call MD for:  persistant nausea and vomiting   Complete by:  As directed    Call MD for:  redness, tenderness, or signs of infection (pain, swelling, redness, odor or green/yellow discharge around incision site)   Complete by:  As directed    Call MD for:  severe uncontrolled pain   Complete by:  As directed    Call MD for:  temperature >101 F   Complete by:  As directed    Diet bariatric full liquid   Complete by:  As directed    Incentive spirometry   Complete by:  As directed    Perform hourly while awake     Allergies as of 06/18/2017   No Known Allergies     Medication List    TAKE these medications   ALAVERT PO Take 10 mg by mouth every morning.   BONINE PO Take 25 mg by mouth every morning.   esomeprazole 20 MG capsule Commonly known as:   NEXIUM Take 40 mg by mouth every morning.            Durable Medical Equipment  (From admission, onward)        Start     Ordered   06/17/17 1216  For home use only DME continuous positive airway pressure (CPAP)  Once    Question Answer Comment  Patient has OSA or probable OSA Yes   Is the patient currently using CPAP in the home Yes   Settings 5-10   CPAP supplies needed Mask, headgear, cushions, filters, heated tubing and water chamber      06/17/17 1216     Follow-up Information    [facility], [facility] [place]. Go on 07/11/2017.   Specialty:  General Surgery Why:  at 0900 with Dr Wenda LowMatt Meryem Haertel Contact information: [address] [place] [address] [phone]        [facility] Follow up.   Specialty:  General Surgery Contact information: [address] [place] [address] [phone]           Signed: Valarie MerinoMatthew B Vallorie Niccoli 06/18/2017, 10:31 AM

## 2017-06-18 NOTE — Progress Notes (Signed)
Patient alert and oriented, pain is controlled. Patient is tolerating fluids, advanced to protein shake today, patient is tolerating well.  Tolerated a complete protein shake this morning.  Continues to drink clear fluids.  Reviewed Gastric sleeve discharge instructions with patient and patient is able to articulate understanding.  Provided information on BELT program, Support Group and [facility] outpatient pharmacy. All questions answered, will continue to monitor.

## 2017-06-18 NOTE — Plan of Care (Signed)
Nutrition Education Note  Received consult for diet education per DROP protocol. Bariatric Nurse Coordinator provided most of education. Pt with no questions for RD at this time.  Discussed 2 week post op diet with pt. Emphasized that liquids must be non carbonated, non caffeinated, and sugar free. Fluid goals discussed. Pt to follow up with outpatient bariatric RD for further diet progression after 2 weeks. Multivitamins and minerals also reviewed. Teach back method used, pt expressed understanding, expect good compliance.   Diet: First 2 Weeks  You will see the nutritionist about two (2) weeks after your surgery. The nutritionist will increase the types of foods you can eat if you are handling liquids well:  If you have severe vomiting or nausea and cannot handle clear liquids lasting longer than 1 day, call your surgeon  Protein Shake  Drink at least 2 ounces of shake 5-6 times per day  Each serving of protein shakes (usually 8 - 12 ounces) should have a minimum of:  15 grams of protein  And no more than 5 grams of carbohydrate  Goal for protein each day:  Men = 80 grams per day  Women = 60 grams per day  Protein powder may be added to fluids such as non-fat milk or Lactaid milk or Soy milk (limit to 35 grams added protein powder per serving)   Hydration  Slowly increase the amount of water and other clear liquids as tolerated (See Acceptable Fluids)  Slowly increase the amount of protein shake as tolerated  Sip fluids slowly and throughout the day  May use sugar substitutes in small amounts (no more than 6 - 8 packets per day; i.e. Splenda)   Fluid Goal  The first goal is to drink at least 8 ounces of protein shake/drink per day (or as directed by the nutritionist); some examples of protein shakes are Premier Protein, [employer] IndustriesSyntrax Nectar, Dillard'sdkins Advantage, EAS Edge HP, and Unjury. See handout from pre-op Bariatric Education Class:  Slowly increase the amount of protein shake you drink as  tolerated  You may find it easier to slowly sip shakes throughout the day  It is important to get your proteins in first  Your fluid goal is to drink 64 - 100 ounces of fluid daily  It may take a few weeks to build up to this  32 oz (or more) should be clear liquids  And  32 oz (or more) should be full liquids (see below for examples)  Liquids should not contain sugar, caffeine, or carbonation   Clear Liquids:  Water or Sugar-free flavored water (i.e. Fruit H2O, Propel)  Decaffeinated coffee or tea (sugar-free)  Crystal Lite, Wyler?s Lite, Minute Maid Lite  Sugar-free Jell-O  Bouillon or broth  Sugar-free Popsicle: *Less than 20 calories each; Limit 1 per day   Full Liquids:  Protein Shakes/Drinks + 2 choices per day of other full liquids  Full liquids must be:  No More Than 12 grams of Carbs per serving  No More Than 3 grams of Fat per serving  Strained low-fat cream soup  Non-Fat milk  Fat-free Lactaid Milk  Sugar-free yogurt (Dannon Lite & Fit, AustriaGreek yogurt, Oikos Zero)   Tilda FrancoLindsey Sheikh Leverich, MS, RD, LDN [facility] Clinical Dietitian Pager: [phone] After Hours Pager: [phone]

## 2017-06-18 NOTE — Progress Notes (Signed)
Patient alert and oriented, Post op day 1.  Provided support and encouragement.  Encouraged pulmonary toilet, ambulation and small sips of liquids.  Completed 12 ounces of clear fluids and started protein.  All questions answered.  Will continue to monitor. 

## 2017-06-20 ENCOUNTER — Telehealth (HOSPITAL_COMMUNITY): Payer: Self-pay

## 2017-06-20 NOTE — Telephone Encounter (Signed)
Follow up with bariatric surgical patient to discuss post discharge questions.  .  1.  Are you having any pain not relieved by pain medication?taken pain med one time and it hleped  2.  How much fluid total fluid intake have you had in the last 24/48 hours?  42 ounces  3.  How much protein intake have you had in the last 24/48 hours?30 grams of protein, using Atkins brand so had 2 shakes  4.  Have you had any trouble making urine?no making urine, color light  5.  Have you had nausea that has not been relieved by nausea medication?has not had nausea  6.  Are you ambulating every hour?yes  7.  Are you passing gas or had a BM?had bm today  8.  Do you know how to contact [facility]? [facility]? [facility]?yes  9.  Are you taking your vitamins and calcium without difficulty?no problems  10. Tell me how your incision looks?  Any redness, open incision, or drainage?look good

## 2017-07-04 ENCOUNTER — Encounter: Payer: BLUE CROSS/BLUE SHIELD | Attending: Surgery | Admitting: Skilled Nursing Facility1

## 2017-07-04 DIAGNOSIS — G4733 Obstructive sleep apnea (adult) (pediatric): Secondary | ICD-10-CM | POA: Insufficient documentation

## 2017-07-04 DIAGNOSIS — Z6841 Body Mass Index (BMI) 40.0 and over, adult: Secondary | ICD-10-CM | POA: Insufficient documentation

## 2017-07-04 DIAGNOSIS — Z713 Dietary counseling and surveillance: Secondary | ICD-10-CM | POA: Insufficient documentation

## 2017-07-04 DIAGNOSIS — E669 Obesity, unspecified: Secondary | ICD-10-CM

## 2017-07-08 ENCOUNTER — Encounter: Payer: Self-pay | Admitting: Skilled Nursing Facility1

## 2017-07-08 NOTE — Progress Notes (Signed)
Bariatric Class:  Appt start time: 1530 end time:  1630.  2 Week Post-Operative Nutrition Class  Patient was seen on 07/04/2017 for Post-Operative Nutrition [facility].   Surgery date: 06/17/2017 Surgery type: sleeve Start weight at [facility]: 261 Weight today: 250  TANITA  BODY COMP RESULTS  07/04/2017   BMI (kg/m^2) 41   Fat Mass (lbs) 124   Fat Free Mass (lbs) 126   Total Body Water (lbs) 92   The following the learning objectives were met by the patient during this course:  Identifies Phase 3A (Soft, High Proteins) Dietary Goals and will begin from 2 weeks post-operatively to 2 months post-operatively  Identifies appropriate sources of fluids and proteins   States protein recommendations and appropriate sources post-operatively  Identifies the need for appropriate texture modifications, mastication, and bite sizes when consuming solids  Identifies appropriate multivitamin and calcium sources post-operatively  Describes the need for physical activity post-operatively and will follow MD recommendations  States when to call healthcare provider regarding medication questions or post-operative complications  Handouts given during class include:  Phase 3A: Soft, High Protein Diet Handout  Follow-Up Plan: Patient will follow-up at [facility] in 6 weeks for 2 month post-op nutrition visit for diet advancement per MD.

## 2017-07-16 ENCOUNTER — Telehealth: Payer: Self-pay | Admitting: Neurology

## 2017-07-16 NOTE — Telephone Encounter (Signed)
Pt has called stating that Dr Vickey Hugerohmeier asked that she called once she has had her bariatric sleeve done.  Pt states it been 1 month since her surgery and she has lost 25 pounds.  Pt is asking for a call to know when her CPAP will be adjusted as a result of this, please call

## 2017-07-16 NOTE — Telephone Encounter (Signed)
Called the patient. Since she is 1 mth post op and she is still loosing I have scheduled her f/u apt in march.

## 2017-08-15 ENCOUNTER — Ambulatory Visit: Payer: BLUE CROSS/BLUE SHIELD | Admitting: Skilled Nursing Facility1

## 2017-09-11 ENCOUNTER — Ambulatory Visit: Payer: BLUE CROSS/BLUE SHIELD | Admitting: Neurology

## 2017-09-11 ENCOUNTER — Encounter: Payer: Self-pay | Admitting: Neurology

## 2017-09-11 VITALS — BP 102/66 | HR 59 | Ht 66.0 in | Wt 231.0 lb

## 2017-09-11 DIAGNOSIS — Z9989 Dependence on other enabling machines and devices: Secondary | ICD-10-CM | POA: Diagnosis not present

## 2017-09-11 DIAGNOSIS — G4733 Obstructive sleep apnea (adult) (pediatric): Secondary | ICD-10-CM

## 2017-09-11 DIAGNOSIS — Z9884 Bariatric surgery status: Secondary | ICD-10-CM

## 2017-09-11 NOTE — Progress Notes (Signed)
[facility]  Provider:  Dr Vickey Huger Referring Provider: Medicine, [facility]* Primary Care Physician:  Medicine, [facility]  Chief Complaint  Patient presents with  . Follow-up    pt alone, rm 10 pt had weight loss surgery dec 10th and she is here to reassess if cpap is still needed    HPI:  RV 09-11-2017,  Natasha Lee is a 57 year old Caucasian right-handed female patient who underwent a gastric sleeve procedure in December 2018. She has lost some weight since her procedure, and she would like to have her CPAP machine follow her hopefully declining need for pressure.  Recently she slept on the sofa one night without her CPAP and it did not go well, she craves her CPAP.  She endorsed an Epworth sleepiness score of 4 points in the low fatigue severity score.  She has 97% compliance for her current CPAP at 9 cmH2O with 1 cm EPR average use of time 7 hours and 10 minutes nightly with a residual AHI of 0.6.  This is an excellent resolution and an excellent compliance.  There is no evidence of central apneas emerging which we would see if her pressure exceeds her needs.  At this time there is also just a moderate air leak which has been increased since mid February. She lost 40 pounds. Her face may have changed - need an adjusted mask, refitting.     Interval history, CD- Natasha Lee is seen here today on 01/23/2017. Her last 2 visits with my nurse practitioner. The patient has been followed in the sleep clinic for 5 years. She has been a compliant CPAP user each year her compliance has been over 90%, this year 97% with an average user time of 7 hours and 26 minutes, CPAP is set at 9 cm water with 1 cm EPR she has a residual AHI of only 0.6 per hour minimal air leaks. She declares her love for her CPAP machine.  She is neither daytime sleepy nor fatigued.  Natasha Lee is seeing Dr. Luretha Murphy and is in the midst of a workup for bariatric surgery.  Over the last 3 years her weight has been stable but she did cane weight over the last 8 years in total, at this time without significant comorbidity. She is neither diabetic nor hypertensive. She is interested in a gastric sleeve surgery. I explained to her that now that her CPAP is about 48.56 year old we would be able to change her to an auto titration machine at the time of surgery, the machine will allow her to find the best pressure for her likely fast decreasing body weight. The surgery will not be before November 2018. I suggest that Natasha Lee will make a follow-up appointment around the time of surgery that we will be able to give her a CPAP machine with auto-titration. She is still followed by [facility] (formerly by [facility]), now in [place], [place] .   Natasha Lee is here today for her yearly revisit. 01-06-14. This is also meant to establish her CPAP compliance. The patient has been on  CPAP 28 month had been followed by [facility]. AHI has been 0.8 /hr. at a CPAP pressure of 9 cm water.  The average time of CPAP use at night to 6 hours and 27 minutes and she has 100% compliance for 30 day download. She has brought her machine to this visit.  The patient endorsed fatigue severity score at 26 points and the Epworth score  at 3 points.   Prior to CPAP, the sleepiness score was endorsed at 19 points and her fatigue severity was extremely high as well. She goes to bed between 9 and 10 PM, goes to sleep promptly. Rises at 4.30 AM, she has 2 dogs to walk. Her husband rises an hour later. She drinks no coffee in the morning , neither any other form of caffeine.  She does not nap in the afternoon.  She wakes restored, refreshed, without headaches or dry mouth.  She gained weight "from menopause on". Hot flashes still present, but not interrupting her sleep as much.    PMHX: last visit 2014 :  Natasha Lee is a 57 y.o. female here as a referral from Dr. Larita Fifehan Badger  MD. Natasha Lee was originally referred at  the end of 2012 directly for a sleep study,  sleep study was performed on 1-11- 2013 and turned into a split study.  She had presented with a past medical history of morbid obesity,  a weight gain of approximately 50 pounds within the past year prior to study , and hypertension, ankle edema.  The patient presented with multiple sleep complaints:  including loud snoring , witnessed apneas, frequent nocturia, unrefreshing non-restorative sleep.  The patient also had excessive daytime hypersomnolence reflected in the pre-study Epworth Sleepiness Scale and Natasha Lee at 19 points. Beck Depression Inventory was endorsed at 22 points. Her BMI at the time of the study was 37. Neck circumference 17 inches.  The patient's bedtime had been 10 PM she geot up at 4:30 AM may have one or 4-5 bathroom breaks at night. 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 She changed in2008 to first shift and started gaining weight rapidly.   The patient has been very happy with her CPAP if faxed she said that she cannot easily go to sleep without it and feels that she certainly would never be as restored and she feels now in the morning without adhering to CPAP therapy she endorsed today her sleepiness score at 8 points.  Prior to CPAP the patient had nocturia and frequent bathroom breaks, she now has one or 2 bathroom break at the moles to on CPAP she also takes her furosemide in the morning which was initiated to combat ankle edema.This medication change took place after the sleep study.   Med history : Patient  has no history of nasal septal deviation or facial oral surgeries that may have affected the upper airway. She never had a tonsillectomy.  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 Most recent surgery Gastric  Sleeve Surgery on 06-17-2017 , surgeon Luretha MurphyMatthew Martin, MD . Weight loss 40 pounds since dec( 3 month ) . Pre diabetic.   Fam Hx: Her father ( a Investment banker, operationalstate police officer) became a dialysis patient in his last years of life, had diabetes and diabetic related renal failure as well as high blood pressure.He had apnea but was not treated . Both grandmothers insulin dependent diabeticMaternal grandmother had  HTN, DM and renal failure. These family members were not overweight.  Mother died in ESRD.  She has a third shift work history , until 10 years ago. 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  Review of Systems: Out of a complete 14 system review, the patient complains of only the following symptoms, and all other reviewed systems are negative.  intended weight loss, Epworth 9 from 18-19 points pre CPAP. FSS 45.  She was titrated to 9 cm water and reached an AHI of 0.0 during that sleep study in 2013 - felt immediately better.   Nasal pillow -Pilairo model .     Social History   Socioeconomic History  . Marital status: Married    Spouse name: Casimiro Needle  . Number of children: 2  . Years of education: 63  . Highest education level: Not on file  Social Needs  . Financial resource strain: Not on file  . Food insecurity - worry: Not on file  . Food insecurity - inability: Not on file  . Transportation needs - medical: Not on file  . Transportation needs - non-medical: Not on file  Occupational History    Employer: [employer]  Tobacco Use  . Smoking status: Never Smoker  . Smokeless tobacco: Never Used  Substance and Sexual Activity  . Alcohol use: No    Alcohol/week: 0.0 oz  . Drug use: No  . Sexual activity: Not on file  Other Topics Concern  . Not on file  Social History Narrative   Patient is married Casimiro Needle ) and lives at home with her husband and one of her children.   Patient has two adult children.   Patient is working full-time.    Patient has a high school education.   Patient is right-handed.   Patient does not drink any caffeine.    Family History  Problem Relation Age of Onset  . Hypertension Father   . Diabetes Other     Past Medical History:  Diagnosis Date  . Menopausal symptoms   . Menopause syndrome   . Obstructive apnea    sleep study 07-20-11 , AHI 61.6- OSA  9 cm CPAP.   Marland Kitchen Vertigo     Past Surgical History:  Procedure Laterality Date  . COLONOSCOPY    . LAPAROSCOPIC GASTRIC SLEEVE RESECTION N/A 06/17/2017   Procedure: LAPAROSCOPIC GASTRIC SLEEVE RESECTION, UPPER ENDOSCOPY;  Surgeon: Luretha Murphy, MD;  Location: [facility];  Service: General;  Laterality: N/A;  . TUBAL LIGATION  1992    Current Outpatient Medications  Medication Sig Dispense Refill  . esomeprazole (NEXIUM) 20 MG capsule Take 40 mg by mouth every morning.    . Loratadine (ALAVERT PO) Take 10 mg by mouth every morning.    . Meclizine HCl (BONINE PO) Take 25 mg by mouth every morning.      No current facility-administered medications for this visit.     Allergies as of 09/11/2017  . (No Known Allergies)    Vitals: BP 102/66   Pulse (!) 59   Ht 5\' 6"  (1.676 m)   Wt 231 lb (104.8 kg)   BMI 37.28 kg/m  Last Weight:  Wt Readings from Last 1 Encounters:  09/11/17 231 lb (104.8 kg)   Last Height:   Ht Readings from Last 1 Encounters:  09/11/17 5\' 6"  (1.676 m)   Physical exam:  General: The patient is awake, alert and appears not in acute distress. The patient is well groomed. Head: Normocephalic, atraumatic. Neck is supple. Mallampati highest grade, uvula is poorly visible.  Nasal congestion present , neck circumference:17.5 inches . Cardiovascular:  Regular rate and rhythm, without  murmurs or carotid bruit, and without distended neck veins. Respiratory: Lungs are clear  to auscultation. Skin:  Without Edema. Trunk: BMI is elevated to morbid obesity , patient  has normal posture. Neurologic exam :The patient is  awake and alert, oriented to place and time.  There is a normal attention span & concentration ability.  Speech is fluent without  dysarthria, dysphonia or aphasia. Slight lisp.  Cranial nerves:  Pupils are equal and briskly reactive to light.  Extraocular movements in vertical and horizontal planes intact and without nystagmus. Visual fields by finger perimetry are intact. Hearing to finger rub intact. Facial sensation intact to fine touch.  Facial motor strength is symmetric and tongue and uvula move midline. Motor exam:   Normal tone and  muscle bulk and symmetric  strength in all extremities. Sensory:  Fine touch, pinprick and vibration were intact -Proprioception is normal  Coordination: Finger-to-nose maneuver without evidence of ataxia, dysmetria or tremor. Gait and station: Patient walks without assistive device  Deep tendon reflexes: in the upper and lower extremities are symmetric - not attenuated.   Assessment/ Plan:    She remains 97% compliant with CPAP and has benefited greatly- her Epworth has been reduced by to 4  points, she has no longer nocturia, she has no longer ankle edema .She is very motivated to continue it's use -  we will try to set her current machine to an auto titration. If not possible may exchange for an auto-titrator. DME [facility]. Her diagnosis was made 06-2011 and CPAP first issued in 07-2011, new CPAP issued 2017-  Given that she currently is doing well at the established pressure of 9 cm, no adjustment has yet to be made, but she could benefit from an autotitration device.    Melvyn Novas, MD    09-11-2017, 09:09 Diplomat of the ABPN, ABSM and medical director of [facility],   accredited by the AASM

## 2018-03-17 ENCOUNTER — Telehealth: Payer: Self-pay | Admitting: Neurology

## 2018-03-17 NOTE — Telephone Encounter (Signed)
Pt called stating she has a mandatory meeting at work on 9/11 at 9am. Pt requesting a call to see if Dr. Vickey Huger will open an appt slot for 8am. Please advise

## 2018-03-17 NOTE — Telephone Encounter (Signed)
Called the pt to make her aware that Dr Vickey Huger was unable to see her earlier in the morning but that Butch Penny NP has an opening available at 8 am on 9/11 if she would prefer to take that apt. Pt agreed to the switch and was advised to check in 7:30 am for the apt at 8 am with NP. Pt verbalized understanding.

## 2018-03-19 ENCOUNTER — Encounter: Payer: Self-pay | Admitting: Adult Health

## 2018-03-19 ENCOUNTER — Ambulatory Visit: Payer: BLUE CROSS/BLUE SHIELD | Admitting: Neurology

## 2018-03-19 ENCOUNTER — Ambulatory Visit: Payer: BLUE CROSS/BLUE SHIELD | Admitting: Adult Health

## 2018-03-19 ENCOUNTER — Other Ambulatory Visit: Payer: Self-pay

## 2018-03-19 VITALS — BP 104/63 | HR 69 | Resp 18 | Ht 66.0 in | Wt 210.5 lb

## 2018-03-19 DIAGNOSIS — Z9989 Dependence on other enabling machines and devices: Secondary | ICD-10-CM

## 2018-03-19 DIAGNOSIS — G4733 Obstructive sleep apnea (adult) (pediatric): Secondary | ICD-10-CM | POA: Diagnosis not present

## 2018-03-19 NOTE — Patient Instructions (Signed)
Your Plan:  Continue using CPAP nightly and >4 hours each night If your symptoms worsen or you develop new symptoms please let us know.   Thank you for coming to see us at [facility]. I hope we have been able to provide you high quality care today.  You may receive a patient satisfaction survey over the next few weeks. We would appreciate your feedback and comments so that we may continue to improve ourselves and the health of our patients.  

## 2018-03-19 NOTE — Progress Notes (Signed)
PATIENT: Natasha Lee DOB: 1961/04/04  REASON FOR VISIT: follow up HISTORY FROM: patient  HISTORY OF PRESENT ILLNESS: Today 03/19/18:  [name] is a 57 year old female with a history of obstructive sleep apnea on CPAP.  Her CPAP download indicates that she use her machine 28 out of 30 days for compliance of 93%.  On average she uses her machine 7 hours and 49 minutes.  She used her machine greater than 4 hours 28 days for compliance of 93%.  Her residual AHI is 0.3 on 9 cm of water with EPR of 1.  Her leak in the 95th percentile is 20.7 L/min.  She reports that the CPAP continues to work well for her.  She denies any new neurological symptoms.  She returns today for evaluation.    HISTORY 09-11-2017 ( Copied from Dr. Oliva Bustard notes) 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 She has lost some weight since her procedure, and she would like to have her CPAP machine follow her hopefully declining need for pressure.  Recently she slept on the sofa one night without her CPAP and it did not go well, she craves her CPAP.  She endorsed an Epworth sleepiness score of 4 points in the low fatigue severity score.  She has 97% compliance for her current CPAP at 9 cmH2O with 1 cm EPR average use of time 7 hours and 10 minutes nightly with a residual AHI of 0.6.  This is an excellent resolution and an excellent compliance.  There is no evidence of central apneas emerging which we would see if her pressure exceeds her needs.  At this time there is also just a moderate air leak which has been increased since mid February. She lost 40 pounds. Her face may have changed - need an adjusted mask, refitting.    REVIEW OF SYSTEMS: Out of a complete 14 system review of symptoms, the patient complains only of the following symptoms, and all other reviewed systems are negative.  See HPI  ALLERGIES: No Known Allergies  HOME  MEDICATIONS: Outpatient Medications Prior to Visit  Medication Sig Dispense Refill  . esomeprazole (NEXIUM) 20 MG capsule Take 40 mg by mouth every morning.    . Loratadine (ALAVERT PO) Take 10 mg by mouth every morning.    . Meclizine HCl (BONINE PO) Take 25 mg by mouth every morning.      No facility-administered medications prior to visit.     PAST MEDICAL HISTORY: Past Medical History:  Diagnosis Date  . Menopausal symptoms   . Menopause syndrome   . Obstructive apnea    sleep study 07-20-11 , AHI 61.6- OSA  9 cm CPAP.   Marland Kitchen Vertigo     PAST SURGICAL HISTORY: Past Surgical History:  Procedure Laterality Date  . COLONOSCOPY    . LAPAROSCOPIC GASTRIC SLEEVE RESECTION N/A 06/17/2017   Procedure: LAPAROSCOPIC GASTRIC SLEEVE RESECTION, UPPER ENDOSCOPY;  Surgeon: Luretha Murphy, MD;  Location: [facility];  Service: General;  Laterality: N/A;  . TUBAL LIGATION  1992    FAMILY HISTORY: Family History  Problem Relation Age of Onset  . Hypertension Father   . Diabetes Other     SOCIAL HISTORY: Social History   Socioeconomic History  . Marital status: Married    Spouse name: Casimiro Needle  . Number of children: 2  . Years of education: 25  . Highest education level: Not on file  Occupational History  Employer: [employer]  Social Needs  . Financial resource strain: Not on file  . Food insecurity:    Worry: Not on file    Inability: Not on file  . Transportation needs:    Medical: Not on file    Non-medical: Not on file  Tobacco Use  . Smoking status: Never Smoker  . Smokeless tobacco: Never Used  Substance and Sexual Activity  . Alcohol use: No    Alcohol/week: 0.0 standard drinks  . Drug use: No  . Sexual activity: Not on file  Lifestyle  . Physical activity:    Days per week: Not on file    Minutes per session: Not on file  . Stress: Not on file  Relationships  . Social connections:    Talks on phone: Not on file    Gets together: Not on file    Attends  religious service: Not on file    Active member of club or organization: Not on file    Attends meetings of clubs or organizations: Not on file    Relationship status: Not on file  . Intimate partner violence:    Fear of current or ex partner: Not on file    Emotionally abused: Not on file    Physically abused: Not on file    Forced sexual activity: Not on file  Other Topics Concern  . Not on file  Social History Narrative   Patient is married Casimiro Needle ) and lives at home with her husband and one of her children.   Patient has two adult children.   Patient is working full-time.   Patient has a high school education.   Patient is right-handed.   Patient does not drink any caffeine.      PHYSICAL EXAM  Vitals:   03/19/18 0801  BP: 104/63  Pulse: 69  Resp: 18  Weight: 210 lb 8 oz (95.5 kg)  Height: 5\' 6"  (1.676 m)   Body mass index is 33.98 kg/m.  Generalized: Well developed, in no acute distress   Neurological examination  Mentation: Alert oriented to time, place, history taking. Follows all commands speech and language fluent Cranial nerve II-XII: Pupils were equal round reactive to light. Extraocular movements were full, visual field were full on confrontational test. Facial sensation and strength were normal. Uvula tongue midline. Head turning and shoulder shrug  were normal and symmetric. Motor: The motor testing reveals 5 over 5 strength of all 4 extremities. Good symmetric motor tone is noted throughout.  Sensory: Sensory testing is intact to soft touch on all 4 extremities. No evidence of extinction is noted.  Coordination: Cerebellar testing reveals good finger-nose-finger and heel-to-shin bilaterally.  Gait and station: Gait is normal.  Reflexes: Deep tendon reflexes are symmetric and normal bilaterally.   DIAGNOSTIC DATA (LABS, IMAGING, TESTING) - I reviewed patient records, labs, notes, testing and imaging myself where available.  Lab Results  Component  Value Date   WBC 10.1 06/18/2017   HGB 12.9 06/18/2017   HCT 37.9 06/18/2017   MCV 88.3 06/18/2017   PLT 222 06/18/2017      Component Value Date/Time   NA 138 06/13/2017 1322   K 4.3 06/13/2017 1322   CL 103 06/13/2017 1322   CO2 25 06/13/2017 1322   GLUCOSE 89 06/13/2017 1322   BUN 27 (H) 06/13/2017 1322   CREATININE 0.97 06/17/2017 1349   CALCIUM 9.9 06/13/2017 1322   PROT 7.6 06/13/2017 1322   ALBUMIN 4.6 06/13/2017 1322   AST  30 06/13/2017 1322   ALT 28 06/13/2017 1322   ALKPHOS 98 06/13/2017 1322   BILITOT 0.9 06/13/2017 1322   GFRNONAA >60 06/17/2017 1349   GFRAA >60 06/17/2017 1349   No results found for: CHOL, HDL, LDLCALC, LDLDIRECT, TRIG, CHOLHDL No results found for: ZOXW9U No results found for: VITAMINB12 No results found for: TSH    ASSESSMENT AND PLAN 57 y.o. year old female  has a past medical history of Menopausal symptoms, Menopause syndrome, Obstructive apnea, and Vertigo. here with:  1.  Obstructive sleep apnea on CPAP  The patient CPAP download shows excellent compliance and good treatment of her apnea.  She is advised that if her symptoms worsen or she develops new symptoms she should let us know.  She will follow-up in 1 year or sooner if needed.   I spent 15 minutes with the patient. 50% of this time was spent reviewing her CPAP download   Butch Penny, MSN, NP-C 03/19/2018, 8:07 AM [facility] [address] [place], [place] [address] [phone]

## 2019-02-18 ENCOUNTER — Telehealth: Payer: Self-pay | Admitting: Neurology

## 2019-02-18 NOTE — Telephone Encounter (Signed)
A new script was recently faxed for the patient on 02/12/19 and received confirmation of fax around 1557. I will resend for the patient.

## 2019-02-18 NOTE — Telephone Encounter (Signed)
Pt called saying she spoke to the company that supplies the cpap supplies to her and they informed her that she is needing a new prescription faxed to them for the supplies. Please advise.

## 2019-03-23 ENCOUNTER — Ambulatory Visit: Payer: BLUE CROSS/BLUE SHIELD | Admitting: Adult Health

## 2019-03-30 ENCOUNTER — Other Ambulatory Visit: Payer: Self-pay

## 2019-03-30 ENCOUNTER — Encounter: Payer: Self-pay | Admitting: Adult Health

## 2019-03-30 ENCOUNTER — Ambulatory Visit: Payer: BC Managed Care – PPO | Admitting: Adult Health

## 2019-03-30 VITALS — BP 113/58 | HR 71 | Temp 97.7°F | Ht 66.0 in | Wt 207.6 lb

## 2019-03-30 DIAGNOSIS — Z9989 Dependence on other enabling machines and devices: Secondary | ICD-10-CM

## 2019-03-30 DIAGNOSIS — G4733 Obstructive sleep apnea (adult) (pediatric): Secondary | ICD-10-CM

## 2019-03-30 NOTE — Patient Instructions (Signed)
Continue using CPAP nightly and greater than 4 hours each night °If your symptoms worsen or you develop new symptoms please let us know.  ° °

## 2019-03-30 NOTE — Progress Notes (Signed)
PATIENT: Natasha SaleDeborah Lee DOB: 06-16-1961  REASON FOR VISIT: follow up HISTORY FROM: patient  HISTORY OF PRESENT ILLNESS: Today 03/30/19:  Natasha Lee is a 58 year old female with a history of obstructive sleep apnea on CPAP.  Her download indicates that she use her machine nightly for compliance of 100%.  She used her machine greater than 4 hours each night.  On average she uses her machine 7 hours and 17 minutes.  Her residual AHI is 0.3 on 9 cm of water with EPR of 1.  Her leak in the 95th percentile is 21.3 L/min.  Reports that the CPAP is working well for her.  She states that she has lost up to 50 pounds but still feels that she needs the CPAP.  She returns today for follow-up.  HISTORY 03/19/18:  Natasha Lee is a 58 year old female with a history of obstructive sleep apnea on CPAP.  Her CPAP download indicates that she use her machine 28 out of 30 days for compliance of 93%.  On average she uses her machine 7 hours and 49 minutes.  She used her machine greater than 4 hours 28 days for compliance of 93%.  Her residual AHI is 0.3 on 9 cm of water with EPR of 1.  Her leak in the 95th percentile is 20.7 L/min.  She reports that the CPAP continues to work well for her.  She denies any new neurological symptoms.  She returns today for evaluation.  REVIEW OF SYSTEMS: Out of a complete 14 system review of symptoms, the patient complains only of the following symptoms, and all other reviewed systems are negative.  Epworth sleepiness score 2 fatigue severity score 15  ALLERGIES: No Known Allergies  HOME MEDICATIONS: Outpatient Medications Prior to Visit  Medication Sig Dispense Refill  . esomeprazole (NEXIUM) 20 MG capsule Take 40 mg by mouth every morning.    . Loratadine (ALAVERT PO) Take 10 mg by mouth every morning.    . Meclizine HCl (BONINE PO) Take 25 mg by mouth every morning.      No facility-administered medications prior to visit.     PAST MEDICAL HISTORY: Past Medical History:   Diagnosis Date  . Menopausal symptoms   . Menopause syndrome   . Obstructive apnea    sleep study 07-20-11 , AHI 61.6- OSA  9 cm CPAP.   Marland Kitchen. Vertigo     PAST SURGICAL HISTORY: Past Surgical History:  Procedure Laterality Date  . COLONOSCOPY    . LAPAROSCOPIC GASTRIC SLEEVE RESECTION N/A 06/17/2017   Procedure: LAPAROSCOPIC GASTRIC SLEEVE RESECTION, UPPER ENDOSCOPY;  Surgeon: Luretha MurphyMartin, Matthew, MD;  Location: [facility];  Service: General;  Laterality: N/A;  . TUBAL LIGATION  1992    FAMILY HISTORY: Family History  Problem Relation Age of Onset  . Hypertension Father   . Diabetes Other     SOCIAL HISTORY: Social History   Socioeconomic History  . Marital status: Married    Spouse name: Casimiro NeedleMichael  . Number of children: 2  . Years of education: 312  . Highest education level: Not on file  Occupational History    Employer: [employer]  Social Needs  . Financial resource strain: Not on file  . Food insecurity    Worry: Not on file    Inability: Not on file  . Transportation needs    Medical: Not on file    Non-medical: Not on file  Tobacco Use  . Smoking status: Never Smoker  . Smokeless tobacco: Never Used  Substance and Sexual Activity  . Alcohol use: No    Alcohol/week: 0.0 standard drinks  . Drug use: No  . Sexual activity: Not on file  Lifestyle  . Physical activity    Days per week: Not on file    Minutes per session: Not on file  . Stress: Not on file  Relationships  . Social Musician on phone: Not on file    Gets together: Not on file    Attends religious service: Not on file    Active member of club or organization: Not on file    Attends meetings of clubs or organizations: Not on file    Relationship status: Not on file  . Intimate partner violence    Fear of current or ex partner: Not on file    Emotionally abused: Not on file    Physically abused: Not on file    Forced sexual activity: Not on file  Other Topics Concern  . Not on file   Social History Narrative   Patient is married Casimiro Needle ) and lives at home with her husband and one of her children.   Patient has two adult children.   Patient is working full-time.   Patient has a high school education.   Patient is right-handed.   Patient does not drink any caffeine.      PHYSICAL EXAM  Vitals:   03/30/19 1325  BP: (!) 113/58  Pulse: 71  Temp: 97.7 F (36.5 C)  TempSrc: Oral  Weight: 207 lb 9.6 oz (94.2 kg)  Height: 5\' 6"  (1.676 m)   Body mass index is 33.51 kg/m.  Generalized: Well developed, in no acute distress  Chest: Lungs clear to auscultation bilaterally  Neurological examination  Mentation: Alert oriented to time, place, history taking. Follows all commands speech and language fluent Cranial nerve II-XII: Extraocular movements were full, visual field were full on confrontational test Head turning and shoulder shrug  were normal and symmetric. Motor: The motor testing reveals 5 over 5 strength of all 4 extremities. Good symmetric motor tone is noted throughout.  Sensory: Sensory testing is intact to soft touch on all 4 extremities. No evidence of extinction is noted.  Gait and station: Gait is normal.   DIAGNOSTIC DATA (LABS, IMAGING, TESTING) - I reviewed patient records, labs, notes, testing and imaging myself where available.  Lab Results  Component Value Date   WBC 10.1 06/18/2017   HGB 12.9 06/18/2017   HCT 37.9 06/18/2017   MCV 88.3 06/18/2017   PLT 222 06/18/2017      Component Value Date/Time   NA 138 06/13/2017 1322   K 4.3 06/13/2017 1322   CL 103 06/13/2017 1322   CO2 25 06/13/2017 1322   GLUCOSE 89 06/13/2017 1322   BUN 27 (H) 06/13/2017 1322   CREATININE 0.97 06/17/2017 1349   CALCIUM 9.9 06/13/2017 1322   PROT 7.6 06/13/2017 1322   ALBUMIN 4.6 06/13/2017 1322   AST 30 06/13/2017 1322   ALT 28 06/13/2017 1322   ALKPHOS 98 06/13/2017 1322   BILITOT 0.9 06/13/2017 1322   GFRNONAA >60 06/17/2017 1349   GFRAA >60  06/17/2017 1349   No results found for: CHOL, HDL, LDLCALC, LDLDIRECT, TRIG, CHOLHDL No results found for: OHFG9M No results found for: VITAMINB12 No results found for: TSH    ASSESSMENT AND PLAN 58 y.o. year old female  has a past medical history of Menopausal symptoms, Menopause syndrome, Obstructive apnea, and Vertigo. here with:  1. Obstructive sleep apnea on CPAP  Patient CPAP download shows excellent compliance and good treatment of her apnea.  She is encouraged to continue using CPAP nightly and greater than 4 hours each night.  She is advised that if her symptoms worsen or she develops new symptoms she should let us know.  She will follow-up in 1 year or sooner if needed   I spent 15 minutes with the patient. 50% of this time was spent discussing CPAP download   Ward Givens, MSN, NP-C 03/30/2019, 1:39 PM [facility] [address] [place], [place] [address] [phone]

## 2019-03-31 NOTE — Progress Notes (Signed)
cpap orders received by [facility].

## 2019-04-21 ENCOUNTER — Telehealth: Payer: Self-pay | Admitting: Adult Health

## 2019-04-21 NOTE — Telephone Encounter (Signed)
Ive contacted [facility] to see what's going on. Waiting to hear back from the. I will update once a hear back

## 2019-04-21 NOTE — Telephone Encounter (Signed)
Pt called stating that she was informed that prescription for her cpap supplies has not been received and she is wanting to know if it could be faxed to [phone] Please advise.

## 2019-05-26 ENCOUNTER — Telehealth: Payer: Self-pay | Admitting: Adult Health

## 2019-05-26 NOTE — Telephone Encounter (Signed)
Pt is requesting refill on her CPAP supplies thru her DME [facility] Fax [phone]

## 2019-05-26 NOTE — Telephone Encounter (Signed)
I spoke to pt and cpap orders were faxed to [facility] in [place], [place], [place] [phone] )areceived fax confirmation).

## 2019-06-29 IMAGING — CR DG CHEST 2V
2 series · 2 of 2 positions shown · non-contrast
Comparison: No prior .

CLINICAL DATA: Morbid obesity.

EXAM:
CHEST  2 VIEW

[w chest pa *]
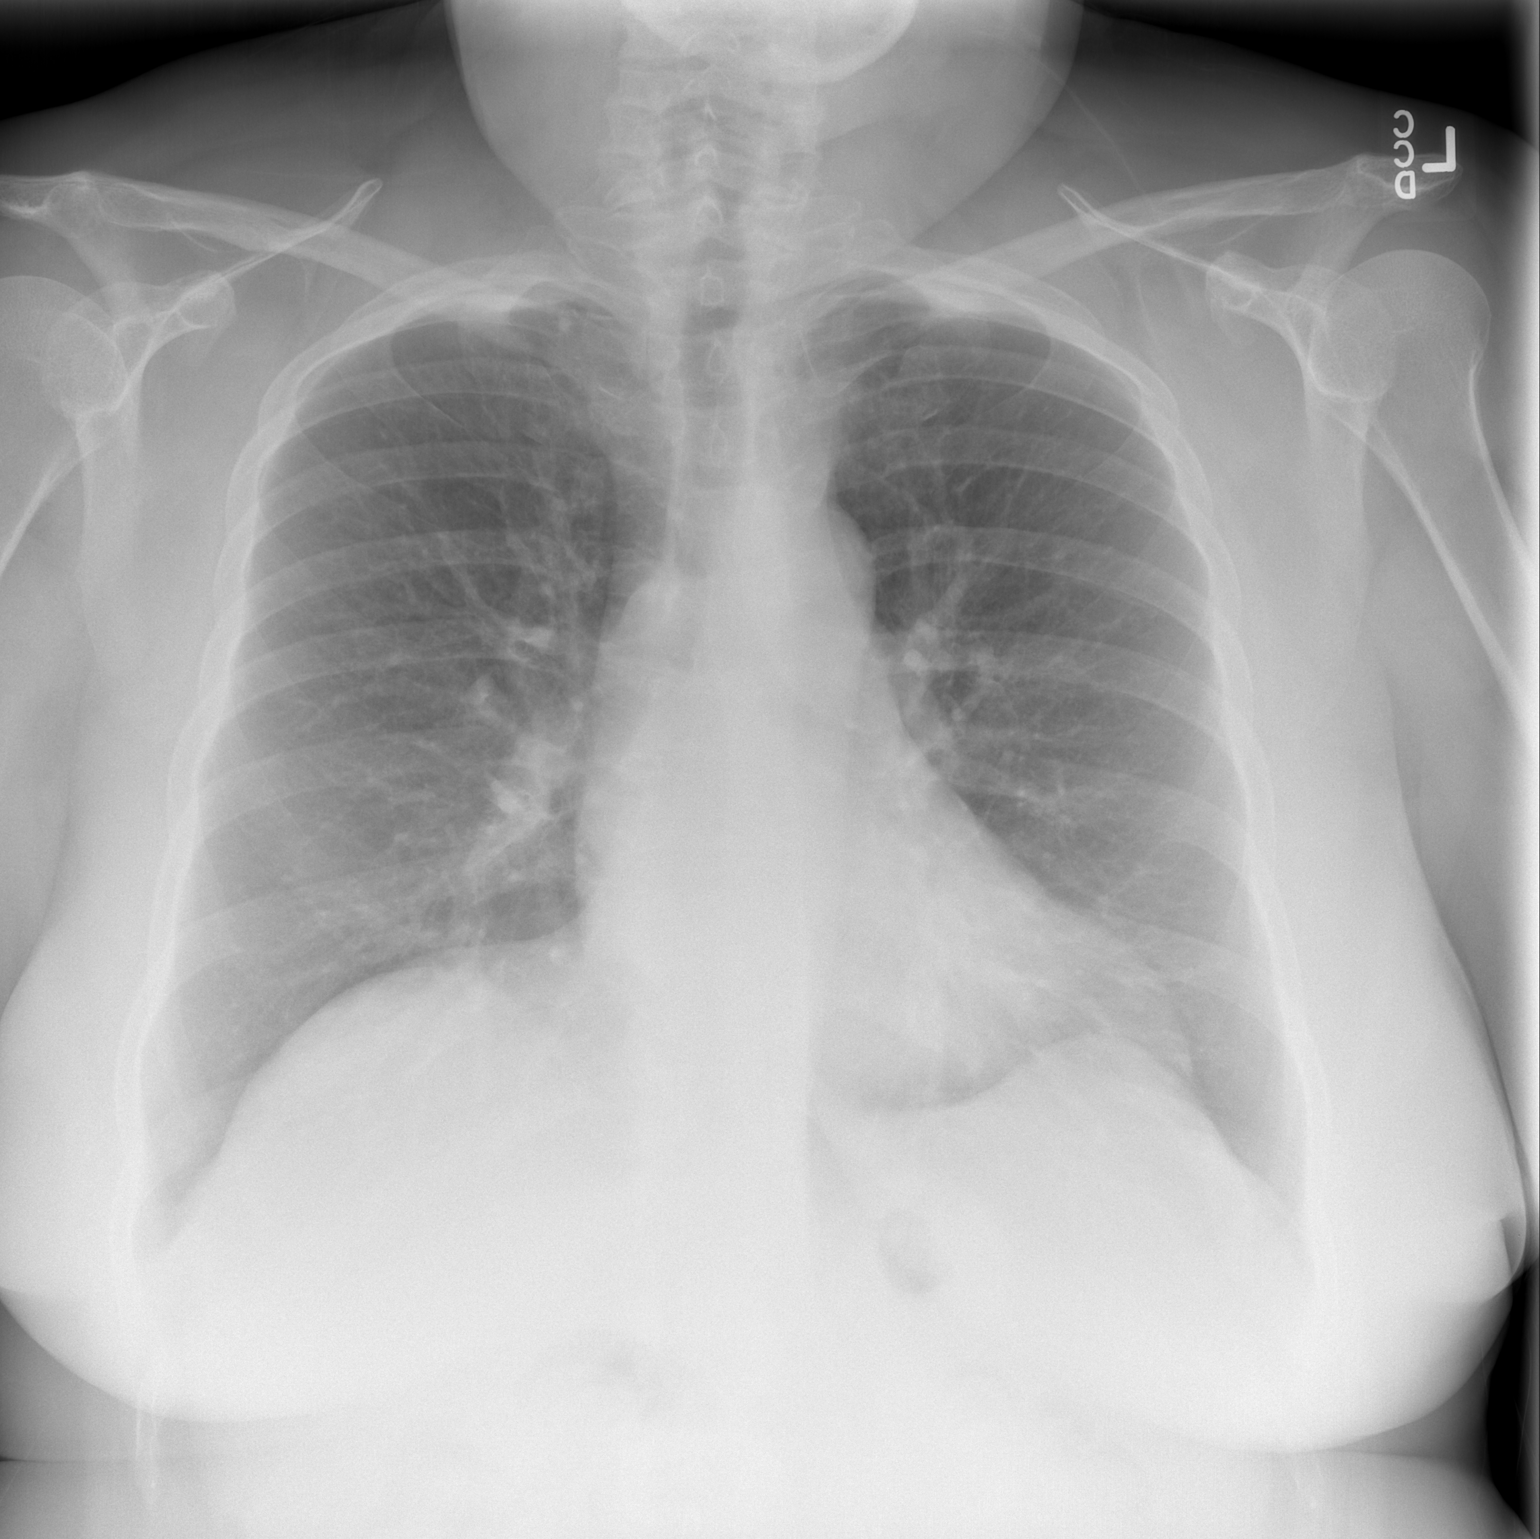

[w chest lat]
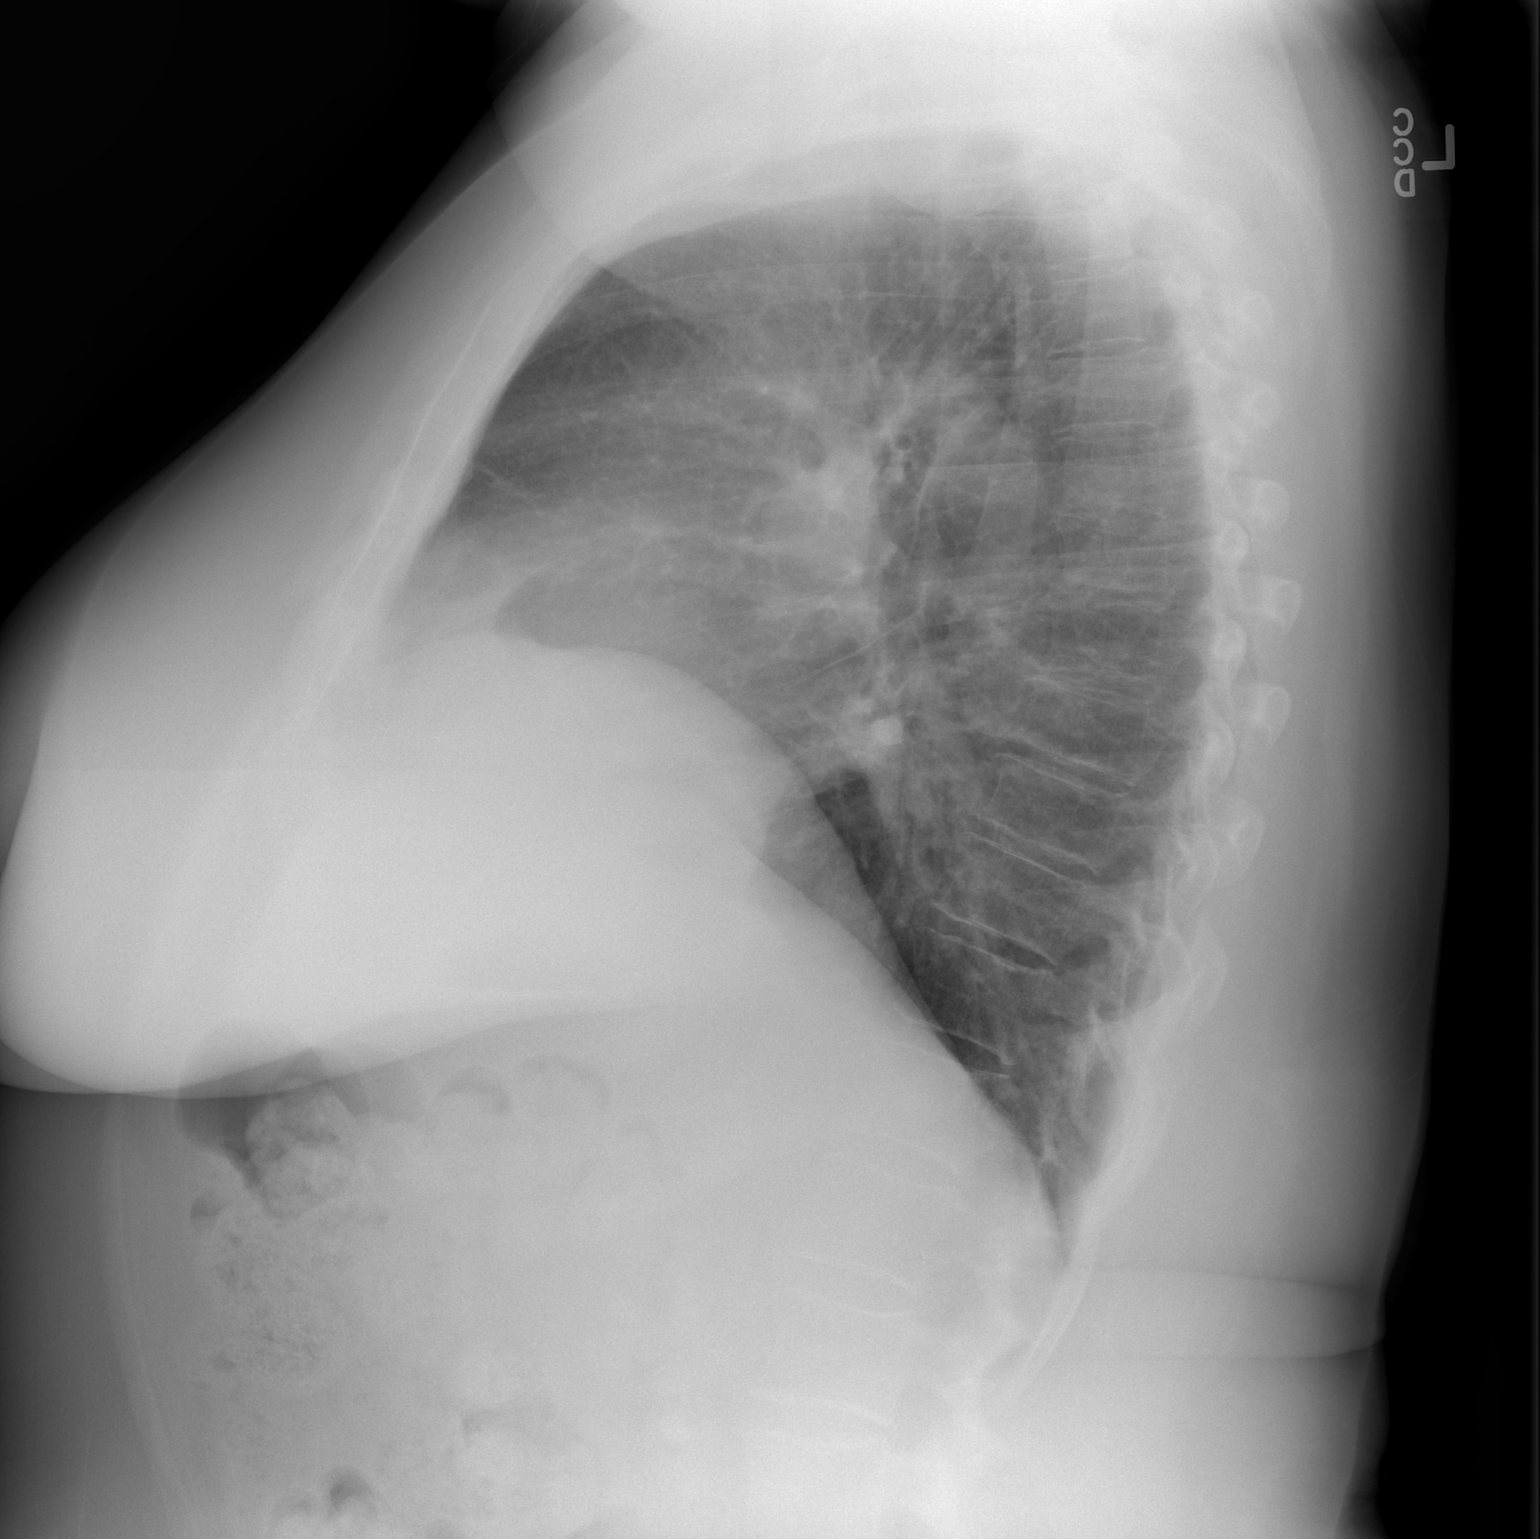

[2 of 2 positions shown; findings below may reference images not displayed]

FINDINGS: Mediastinum and hilar structures normal.

Low lung volumes with bibasilar subsegmental atelectasis. Biapical
pleural-parenchymal thickening noted most consistent scarring. Tiny
very radiopaque 4 mm nodular density projected over the right apex.
This most likely represents tiny granuloma or tiny bone island
within right third rib or clavicle .
IMPRESSION: 1.  Mild bibasilar subsegmental atelectasis.

2. Tiny 4 mm very radiopaque nodular opacity noted over the right
apex. This most likely represents a tiny granuloma or tiny bone
island within the right posterior third rib or clavicle. Apical
lordotic view of the chest suggested for further evaluation .

## 2020-03-28 ENCOUNTER — Ambulatory Visit: Payer: BC Managed Care – PPO | Admitting: Adult Health

## 2020-07-11 ENCOUNTER — Ambulatory Visit: Payer: Self-pay | Admitting: Adult Health

## 2020-11-15 ENCOUNTER — Ambulatory Visit: Payer: BLUE CROSS/BLUE SHIELD | Admitting: Adult Health

## 2021-03-06 NOTE — Progress Notes (Signed)
PATIENT: Natasha Lee DOB: 11-14-1960  REASON FOR VISIT: follow up HISTORY FROM: patient  HISTORY OF PRESENT ILLNESS: Today 03/06/21:  Natasha Lee is a 60 year old female with a history of obstructive sleep apnea on CPAP.  She returns today for follow-up.  Reports that the CPAP is working well for her.  She denies any new issues.    03/30/19 :Natasha Lee is a 60 year old female with a history of obstructive sleep apnea on CPAP.  Her download indicates that she use her machine nightly for compliance of 100%.  She used her machine greater than 4 hours each night.  On average she uses her machine 7 hours and 17 minutes.  Her residual AHI is 0.3 on 9 cm of water with EPR of 1.  Her leak in the 95th percentile is 21.3 L/min.  Reports that the CPAP is working well for her.  She states that she has lost up to 50 pounds but still feels that she needs the CPAP.  She returns today for follow-up.  HISTORY 03/19/18:   Natasha Lee is a 60 year old female with a history of obstructive sleep apnea on CPAP.  Her CPAP download indicates that she use her machine 28 out of 30 days for compliance of 93%.  On average she uses her machine 7 hours and 49 minutes.  She used her machine greater than 4 hours 28 days for compliance of 93%.  Her residual AHI is 0.3 on 9 cm of water with EPR of 1.  Her leak in the 95th percentile is 20.7 L/min.  She reports that the CPAP continues to work well for her.  She denies any new neurological symptoms.  She returns today for evaluation.  REVIEW OF SYSTEMS: Out of a complete 14 system review of symptoms, the patient complains only of the following symptoms, and all other reviewed systems are negative.  Epworth sleepiness score 4  FSS 35  ALLERGIES: No Known Allergies  HOME MEDICATIONS: Outpatient Medications Prior to Visit  Medication Sig Dispense Refill   esomeprazole (NEXIUM) 20 MG capsule Take 40 mg by mouth every morning.     Loratadine (ALAVERT PO) Take 10 mg by mouth  every morning.     Meclizine HCl (BONINE PO) Take 25 mg by mouth every morning.      No facility-administered medications prior to visit.    PAST MEDICAL HISTORY: Past Medical History:  Diagnosis Date   Menopausal symptoms    Menopause syndrome    Obstructive apnea    sleep study 07-20-11 , AHI 61.6- OSA  9 cm CPAP.    Vertigo     PAST SURGICAL HISTORY: Past Surgical History:  Procedure Laterality Date   COLONOSCOPY     LAPAROSCOPIC GASTRIC SLEEVE RESECTION N/A 06/17/2017   Procedure: LAPAROSCOPIC GASTRIC SLEEVE RESECTION, UPPER ENDOSCOPY;  Surgeon: Luretha Murphy, MD;  Location: [facility];  Service: General;  Laterality: N/A;   TUBAL LIGATION  1992    FAMILY HISTORY: Family History  Problem Relation Age of Onset   Hypertension Father    Diabetes Other     SOCIAL HISTORY: Social History   Socioeconomic History   Marital status: Married    Spouse name: Casimiro Needle   Number of children: 2   Years of education: 12   Highest education level: Not on file  Occupational History    Employer: [employer]  Tobacco Use   Smoking status: Never   Smokeless tobacco: Never  Vaping Use   Vaping Use: Never used  Substance and Sexual Activity   Alcohol use: No    Alcohol/week: 0.0 standard drinks   Drug use: No   Sexual activity: Not on file  Other Topics Concern   Not on file  Social History Narrative   Patient is married Casimiro Needle ) and lives at home with her husband and one of her children.   Patient has two adult children.   Patient is working full-time.   Patient has a high school education.   Patient is right-handed.   Patient does not drink any caffeine.   Social Determinants of Health   Financial Resource Strain: Not on file  Food Insecurity: Not on file  Transportation Needs: Not on file  Physical Activity: Not on file  Stress: Not on file  Social Connections: Not on file  Intimate Partner Violence: Not on file      PHYSICAL EXAM  Vitals:   03/07/21  0859  BP: 112/71  Pulse: 71  Weight: 223 lb (101.2 kg)  Height: 5\' 6"  (1.676 m)    Body mass index is 35.99 kg/m.  Generalized: Well developed, in no acute distress  Chest: Lungs clear to auscultation bilaterally  Neurological examination  Mentation: Alert oriented to time, place, history taking. Follows all commands speech and language fluent Cranial nerve II-XII: Extraocular movements were full, visual field were full on confrontational test Head turning and shoulder shrug  were normal and symmetric. Motor: The motor testing reveals 5 over 5 strength of all 4 extremities. Good symmetric motor tone is noted throughout.  Sensory: Sensory testing is intact to soft touch on all 4 extremities. No evidence of extinction is noted.  Gait and station: Gait is normal.   DIAGNOSTIC DATA (LABS, IMAGING, TESTING) - I reviewed patient records, labs, notes, testing and imaging myself where available.  Lab Results  Component Value Date   WBC 10.1 06/18/2017   HGB 12.9 06/18/2017   HCT 37.9 06/18/2017   MCV 88.3 06/18/2017   PLT 222 06/18/2017      Component Value Date/Time   NA 138 06/13/2017 1322   K 4.3 06/13/2017 1322   CL 103 06/13/2017 1322   CO2 25 06/13/2017 1322   GLUCOSE 89 06/13/2017 1322   BUN 27 (H) 06/13/2017 1322   CREATININE 0.97 06/17/2017 1349   CALCIUM 9.9 06/13/2017 1322   PROT 7.6 06/13/2017 1322   ALBUMIN 4.6 06/13/2017 1322   AST 30 06/13/2017 1322   ALT 28 06/13/2017 1322   ALKPHOS 98 06/13/2017 1322   BILITOT 0.9 06/13/2017 1322   GFRNONAA >60 06/17/2017 1349   GFRAA >60 06/17/2017 1349      ASSESSMENT AND PLAN 60 y.o. year old female  has a past medical history of Menopausal symptoms, Menopause syndrome, Obstructive apnea, and Vertigo. here with:  Obstructive sleep apnea on CPAP  Patient CPAP download shows excellent compliance and good treatment of her apnea.  She is encouraged to continue using CPAP nightly and greater than 4 hours each night.   She is advised that if her symptoms worsen or she develops new symptoms she should let 46 know.  She will follow-up in 1 year or sooner if needed  [place], MSN, NP-C 03/06/2021, 6:59 PM [facility] [address], [address] [place], [place] [phone]

## 2021-03-07 ENCOUNTER — Encounter: Payer: Self-pay | Admitting: Adult Health

## 2021-03-07 ENCOUNTER — Other Ambulatory Visit: Payer: Self-pay

## 2021-03-07 ENCOUNTER — Ambulatory Visit: Payer: No Typology Code available for payment source | Admitting: Adult Health

## 2021-03-07 VITALS — BP 112/71 | HR 71 | Ht 66.0 in | Wt 223.0 lb

## 2021-03-07 DIAGNOSIS — G4733 Obstructive sleep apnea (adult) (pediatric): Secondary | ICD-10-CM

## 2021-03-07 DIAGNOSIS — Z9989 Dependence on other enabling machines and devices: Secondary | ICD-10-CM

## 2021-03-07 NOTE — Patient Instructions (Signed)
Continue using CPAP nightly and greater than 4 hours each night °If your symptoms worsen or you develop new symptoms please let us know.  ° °

## 2022-01-07 ENCOUNTER — Encounter: Payer: Self-pay | Admitting: Adult Health

## 2022-01-12 ENCOUNTER — Encounter (HOSPITAL_COMMUNITY): Payer: Self-pay | Admitting: *Deleted

## 2022-03-19 ENCOUNTER — Encounter: Payer: Self-pay | Admitting: *Deleted

## 2022-03-20 ENCOUNTER — Telehealth: Payer: No Typology Code available for payment source | Admitting: Adult Health

## 2022-08-07 ENCOUNTER — Encounter: Payer: Self-pay | Admitting: *Deleted

## 2022-08-07 NOTE — Progress Notes (Unsigned)
PATIENT: Natasha Lee DOB: [birth year]  REASON FOR VISIT: follow up HISTORY FROM: patient PRIMARY NEUROLOGIST:   Virtual Visit via Video Note  I connected with Natasha Lee on 08/08/22 at  2:15 PM EST by a video enabled telemedicine application located remotely at [facility] and verified that I am speaking with the correct person using two identifiers who was located at their own home.   I discussed the limitations of evaluation and management by telemedicine and the availability of in person appointments. The patient expressed understanding and agreed to proceed.   PATIENT: [name] DOB: [birth year]  REASON FOR VISIT: follow up HISTORY FROM: patient   HISTORY OF PRESENT ILLNESS: Today 08/08/22:  Natasha Lee is a 62 y.o. female with a history of OSA on CPAP. Returns today for follow-up. Reports that she has not been using consistently due to runny nose and congestion.  Currently using the nasal pillows.  Tends to only get congestion and runny nose at bedtime         REVIEW OF SYSTEMS: Out of a complete 14 system review of symptoms, the patient complains only of the following symptoms, and all other reviewed systems are negative.  ALLERGIES: No Known Allergies  HOME MEDICATIONS: Outpatient Medications Prior to Visit  Medication Sig Dispense Refill   Loratadine (ALAVERT PO) Take 10 mg by mouth every morning.     Meclizine HCl (BONINE PO) Take 25 mg by mouth every morning.      No facility-administered medications prior to visit.    PAST MEDICAL HISTORY: Past Medical History:  Diagnosis Date   Menopausal symptoms    Menopause syndrome    Obstructive apnea    sleep study 07-20-11 , AHI 61.6- OSA  9 cm CPAP.    Sleep apnea    Vertigo     PAST SURGICAL HISTORY: Past Surgical History:  Procedure Laterality Date   COLONOSCOPY     LAPAROSCOPIC GASTRIC SLEEVE RESECTION N/A 06/17/2017   Procedure: LAPAROSCOPIC GASTRIC SLEEVE RESECTION,  UPPER ENDOSCOPY;  Surgeon: Luretha Murphy, MD;  Location: [facility];  Service: General;  Laterality: N/A;   TUBAL LIGATION  1992    FAMILY HISTORY: Family History  Problem Relation Age of Onset   Hypertension Father    Diabetes Other    Sleep apnea Neg Hx     SOCIAL HISTORY: Social History   Socioeconomic History   Marital status: Married    Spouse name: Natasha Lee   Number of children: 2   Years of education: 12   Highest education level: Not on file  Occupational History    Employer: Advertising copywriter  Tobacco Use   Smoking status: Never   Smokeless tobacco: Never  Vaping Use   Vaping Use: Never used  Substance and Sexual Activity   Alcohol use: No    Alcohol/week: 0.0 standard drinks of alcohol   Drug use: No   Sexual activity: Not on file  Other Topics Concern   Not on file  Social History Narrative   Patient is married Natasha Lee ) and lives at home with her husband and one of her children.   Patient has two adult children.   Patient is working full-time.   Patient has a high school education.   Patient is right-handed.   Patient does not drink any caffeine.   Social Determinants of Health   Financial Resource Strain: Not on file  Food Insecurity: Not on file  Transportation Needs: Not on file  Physical Activity: Not  on file  Stress: Not on file  Social Connections: Not on file  Intimate Partner Violence: Not on file      PHYSICAL EXAM Generalized: Well developed, in no acute distress   Neurological examination  Mentation: Alert oriented to time, place, history taking. Follows all commands speech and language fluent Cranial nerve II-XII:Extraocular movements were full. Facial symmetry noted.  Head turning and shoulder shrug  were normal and symmetric. Motor: Good strength throughout subjectively per patient Sensory: Sensory testing is intact to soft touch on all 4 extremities subjectively per patient Coordination: Cerebellar testing reveals good  finger-nose-finger  Gait and station: Patient is able to stand from a seated position. gait is normal.  Reflexes: UTA  DIAGNOSTIC DATA (LABS, IMAGING, TESTING) - I reviewed patient records, labs, notes, testing and imaging myself where available.  Lab Results  Component Value Date   WBC 10.1 06/18/2017   HGB 12.9 06/18/2017   HCT 37.9 06/18/2017   MCV 88.3 06/18/2017   PLT 222 06/18/2017      Component Value Date/Time   NA 138 06/13/2017 1322   K 4.3 06/13/2017 1322   CL 103 06/13/2017 1322   CO2 25 06/13/2017 1322   GLUCOSE 89 06/13/2017 1322   BUN 27 (H) 06/13/2017 1322   CREATININE 0.97 06/17/2017 1349   CALCIUM 9.9 06/13/2017 1322   PROT 7.6 06/13/2017 1322   ALBUMIN 4.6 06/13/2017 1322   AST 30 06/13/2017 1322   ALT 28 06/13/2017 1322   ALKPHOS 98 06/13/2017 1322   BILITOT 0.9 06/13/2017 1322   GFRNONAA >60 06/17/2017 1349   GFRAA >60 06/17/2017 1349       ASSESSMENT AND PLAN 62 y.o. year old female  has a past medical history of Menopausal symptoms, Menopause syndrome, Obstructive apnea, Sleep apnea, and Vertigo. here with:  OSA on CPAP  Noncompliant with CPAP Currently using nasal pillows we will order a mask refitting to see if a different mask would help with nasal congestion F/U in 6 months or sooner if needed    Butch Penny, MSN, NP-C 08/08/2022, 2:12 PM [facility] [address] [place], [place] [address] [phone]

## 2022-08-08 ENCOUNTER — Telehealth: Payer: No Typology Code available for payment source | Admitting: Adult Health

## 2022-08-08 DIAGNOSIS — G4733 Obstructive sleep apnea (adult) (pediatric): Secondary | ICD-10-CM | POA: Diagnosis not present

## 2022-08-09 NOTE — Progress Notes (Signed)
Order for mask refit faxed to DME: [facility] fax [phone]. Received a receipt of confirmation.

## 2022-11-21 ENCOUNTER — Telehealth: Payer: Self-pay | Admitting: Adult Health

## 2022-11-21 NOTE — Telephone Encounter (Signed)
I called [facility] in [place].  Need the last ofv note. I faxed to them the last office note and the order at [phone]. Received fax confirmation.

## 2022-11-21 NOTE — Telephone Encounter (Signed)
Pt stated she needs a order for nose clip mask. [facility] in [place]: Phone- [phone]

## 2023-01-22 ENCOUNTER — Encounter (HOSPITAL_COMMUNITY): Payer: Self-pay | Admitting: *Deleted

## 2023-03-08 ENCOUNTER — Telehealth: Payer: No Typology Code available for payment source | Admitting: Adult Health

## 2024-01-24 ENCOUNTER — Encounter (HOSPITAL_COMMUNITY): Payer: Self-pay | Admitting: *Deleted
